# Patient Record
Sex: Male | Born: 1960 | Race: Black or African American | Hispanic: No | Marital: Married | Smoking: Current some day smoker
Health system: Southern US, Community
[De-identification: ages and names within clinical notes are randomized; demographics above are authoritative.]

## PROBLEM LIST (undated history)

## (undated) DIAGNOSIS — I10 Essential (primary) hypertension: Secondary | ICD-10-CM

## (undated) DIAGNOSIS — K219 Gastro-esophageal reflux disease without esophagitis: Secondary | ICD-10-CM

## (undated) DIAGNOSIS — E7800 Pure hypercholesterolemia, unspecified: Secondary | ICD-10-CM

## (undated) DIAGNOSIS — K859 Acute pancreatitis without necrosis or infection, unspecified: Secondary | ICD-10-CM

## (undated) HISTORY — PX: COLOSTOMY: SHX63

## (undated) HISTORY — PX: COLOSTOMY REVERSAL: SHX5782

---

## 2010-06-10 ENCOUNTER — Emergency Department (HOSPITAL_BASED_OUTPATIENT_CLINIC_OR_DEPARTMENT_OTHER): Admission: EM | Admit: 2010-06-10 | Discharge: 2010-06-10 | Payer: Self-pay | Admitting: Emergency Medicine

## 2010-11-14 LAB — BASIC METABOLIC PANEL
BUN: 9 mg/dL (ref 6–23)
Calcium: 9.3 mg/dL (ref 8.4–10.5)
GFR calc non Af Amer: >60 mL/min (ref >60)
Glucose, Bld: 105 mg/dL — ABNORMAL HIGH (ref 70–99)

## 2011-05-24 ENCOUNTER — Emergency Department (HOSPITAL_BASED_OUTPATIENT_CLINIC_OR_DEPARTMENT_OTHER)
Admission: EM | Admit: 2011-05-24 | Discharge: 2011-05-24 | Disposition: A | Discharge status: Home / Self Care | Payer: Self-pay | Attending: Emergency Medicine | Admitting: Emergency Medicine

## 2011-05-24 ENCOUNTER — Emergency Department (INDEPENDENT_AMBULATORY_CARE_PROVIDER_SITE_OTHER): Payer: Self-pay

## 2011-05-24 ENCOUNTER — Encounter: Payer: Self-pay | Admitting: *Deleted

## 2011-05-24 DIAGNOSIS — J069 Acute upper respiratory infection, unspecified: Secondary | ICD-10-CM | POA: Insufficient documentation

## 2011-05-24 DIAGNOSIS — E119 Type 2 diabetes mellitus without complications: Secondary | ICD-10-CM | POA: Insufficient documentation

## 2011-05-24 DIAGNOSIS — R509 Fever, unspecified: Secondary | ICD-10-CM

## 2011-05-24 DIAGNOSIS — R1110 Vomiting, unspecified: Secondary | ICD-10-CM | POA: Insufficient documentation

## 2011-05-24 DIAGNOSIS — R0602 Shortness of breath: Secondary | ICD-10-CM

## 2011-05-24 DIAGNOSIS — I10 Essential (primary) hypertension: Secondary | ICD-10-CM | POA: Insufficient documentation

## 2011-05-24 DIAGNOSIS — R42 Dizziness and giddiness: Secondary | ICD-10-CM

## 2011-05-24 DIAGNOSIS — K219 Gastro-esophageal reflux disease without esophagitis: Secondary | ICD-10-CM | POA: Insufficient documentation

## 2011-05-24 DIAGNOSIS — R05 Cough: Secondary | ICD-10-CM

## 2011-05-24 HISTORY — DX: Essential (primary) hypertension: I10

## 2011-05-24 HISTORY — DX: Gastro-esophageal reflux disease without esophagitis: K21.9

## 2011-05-24 LAB — BASIC METABOLIC PANEL
CO2: 19 mEq/L (ref 19–32)
Calcium: 10 mg/dL (ref 8.4–10.5)
Creatinine, Ser: 0.9 mg/dL (ref 0.50–1.35)
GFR calc Af Amer: >60 mL/min (ref >60)

## 2011-05-24 LAB — CBC
MCV: 83.8 fL (ref 78.0–100.0)
Platelets: 158 10*3/uL (ref 150–400)
RDW: 12.1 % (ref 11.5–15.5)
WBC: 3.9 10*3/uL — ABNORMAL LOW (ref 4.0–10.5)

## 2011-05-24 MED ORDER — ONDANSETRON HCL 4 MG/2ML IJ SOLN
4.0000 mg | Freq: Once | INTRAMUSCULAR | Status: AC
  Administered 2011-05-24: 4 mg via INTRAVENOUS
  Filled 2011-05-24: qty 2

## 2011-05-24 MED ORDER — SODIUM CHLORIDE 0.9 % IV BOLUS (SEPSIS)
250.0000 mL | Freq: Once | INTRAVENOUS | Status: AC
  Administered 2011-05-24: 1000 mL via INTRAVENOUS

## 2011-05-24 MED ORDER — ONDANSETRON 8 MG PO TBDP: 8.0000 mg | ORAL_TABLET | Freq: Three times a day (TID) | ORAL | Status: AC | PRN

## 2011-05-24 MED ORDER — SODIUM CHLORIDE 0.9 % IV SOLN: INTRAVENOUS | Status: DC

## 2011-05-24 MED ORDER — AMLODIPINE BESYLATE 10 MG PO TABS: 5.0000 mg | ORAL_TABLET | Freq: Two times a day (BID) | ORAL | Status: AC

## 2011-05-24 NOTE — ED Notes (Signed)
Patient states that he started feeling bad last night, vomited last night and this morning, light headed, fever, shortness of breath, productive cough, brown colored per patient, took alka seltzer last night

## 2011-06-29 NOTE — ED Provider Notes (Signed)
History     CSN: [id number] Arrival date & time: 05/24/2011 10:56 AM   None     Chief Complaint  Patient presents with  . Shortness of Breath    (Consider location/radiation/quality/duration/timing/severity/associated sxs/prior treatment) Patient is a 50 y.o. male presenting with shortness of breath. The history is provided by the patient.  Shortness of Breath  The current episode started yesterday. The problem occurs frequently. The problem has been unchanged. The problem is moderate. The symptoms are relieved by nothing. The symptoms are aggravated by nothing. Associated symptoms include a fever, cough and shortness of breath. Pertinent negatives include no chest pain, no chest pressure, no sore throat, no stridor and no wheezing. The cough is productive. There is no color change Manson Passey) associated with the cough. Nothing relieves the cough. Nothing worsens the cough. His past medical history does not include asthma. There were no sick contacts.   Patient started feeling bad yesterday vomited last night and this morning today feels lightheaded feels feverish short of breath productive cough of brown-colored sputum. No diarrhea. Past Medical History  Diagnosis Date  . Hypertension   . Diabetes mellitus   . Acid reflux     No past surgical history on file.  No family history on file.  History  Substance Use Topics  . Smoking status: Current Some Day Smoker  . Smokeless tobacco: Not on file  . Alcohol Use: Yes      Review of Systems  Constitutional: Positive for fever.  HENT: Positive for congestion. Negative for sore throat and neck pain.   Eyes: Negative for redness.  Respiratory: Positive for cough and shortness of breath. Negative for wheezing and stridor.   Cardiovascular: Negative for chest pain.  Genitourinary: Negative for dysuria and hematuria.  Musculoskeletal: Negative for back pain.  Neurological: Negative for headaches.  Hematological: Does not bruise/bleed  easily.    Allergies  Review of patient's allergies indicates no known allergies.  Home Medications   Current Outpatient Rx  Name Route Sig Dispense Refill  . AMLODIPINE BESYLATE PO Oral Take by mouth.      Marland Kitchen UNKNOWN TO PATIENT  Pill for diabetes     . AMLODIPINE BESYLATE 10 MG PO TABS Oral Take 0.5 tablets (5 mg total) by mouth 2 (two) times daily. 60 tablet 0    BP 176/100  Pulse 79  Temp(Src) 97.5 F (36.4 C) (Oral)  Resp 19  SpO2 100%  Physical Exam  Nursing note and vitals reviewed. Constitutional: He is oriented to person, place, and time. He appears well-developed and well-nourished. No distress.  HENT:  Head: Normocephalic and atraumatic.  Mouth/Throat: Oropharynx is clear and moist.  Eyes: Conjunctivae are normal. Pupils are equal, round, and reactive to light.  Neck: Normal range of motion. Neck supple.  Cardiovascular: Normal rate, regular rhythm and normal heart sounds.   No murmur heard. Pulmonary/Chest: Effort normal and breath sounds normal. He has no wheezes. He has no rales.  Abdominal: Soft. Bowel sounds are normal. There is no tenderness.  Musculoskeletal: Normal range of motion. He exhibits no edema.  Lymphadenopathy:    He has no cervical adenopathy.  Neurological: He is alert and oriented to person, place, and time. No cranial nerve deficit. He exhibits normal muscle tone. Coordination normal.  Skin: Skin is warm and dry. No rash noted. He is not diaphoretic. No erythema.    ED Course  Procedures (including critical care time)  Labs Reviewed  BASIC METABOLIC PANEL - Abnormal; Notable for the  following:    Glucose, Bld 143 (*)    All other components within normal limits  CBC - Abnormal; Notable for the following:    WBC 3.9 (*)    HCT 38.4 (*)    MCHC 37.0 (*)    All other components within normal limits   Results for orders placed during the hospital encounter of 05/24/11  BASIC METABOLIC PANEL      Component Value Range   Sodium 139   135 - 145 (mEq/L)   Potassium 3.7  3.5 - 5.1 (mEq/L)   Chloride 100  96 - 112 (mEq/L)   CO2 19  19 - 32 (mEq/L)   Glucose, Bld 143 (*) 70 - 99 (mg/dL)   BUN 9  6 - 23 (mg/dL)   Creatinine, Ser 1.61  0.50 - 1.35 (mg/dL)   Calcium 09.6  8.4 - 10.5 (mg/dL)   GFR calc non Af Amer >60  >60 (mL/min)   GFR calc Af Amer >60  >60 (mL/min)  CBC      Component Value Range   WBC 3.9 (*) 4.0 - 10.5 (K/uL)   RBC 4.58  4.22 - 5.81 (MIL/uL)   Hemoglobin 14.2  13.0 - 17.0 (g/dL)   HCT 04.5 (*) 40.9 - 52.0 (%)   MCV 83.8  78.0 - 100.0 (fL)   MCH 31.0  26.0 - 34.0 (pg)   MCHC 37.0 (*) 30.0 - 36.0 (g/dL)   RDW 81.1  91.4 - 78.2 (%)   Platelets 158  150 - 400 (K/uL)   Results for orders placed during the hospital encounter of 05/24/11  BASIC METABOLIC PANEL      Component Value Range   Sodium 139  135 - 145 (mEq/L)   Potassium 3.7  3.5 - 5.1 (mEq/L)   Chloride 100  96 - 112 (mEq/L)   CO2 19  19 - 32 (mEq/L)   Glucose, Bld 143 (*) 70 - 99 (mg/dL)   BUN 9  6 - 23 (mg/dL)   Creatinine, Ser 9.56  0.50 - 1.35 (mg/dL)   Calcium 21.3  8.4 - 10.5 (mg/dL)   GFR calc non Af Amer >60  >60 (mL/min)   GFR calc Af Amer >60  >60 (mL/min)  CBC      Component Value Range   WBC 3.9 (*) 4.0 - 10.5 (K/uL)   RBC 4.58  4.22 - 5.81 (MIL/uL)   Hemoglobin 14.2  13.0 - 17.0 (g/dL)   HCT 08.6 (*) 57.8 - 52.0 (%)   MCV 83.8  78.0 - 100.0 (fL)   MCH 31.0  26.0 - 34.0 (pg)   MCHC 37.0 (*) 30.0 - 36.0 (g/dL)   RDW 46.9  62.9 - 52.8 (%)   Platelets 158  150 - 400 (K/uL)   Results for orders placed during the hospital encounter of 05/24/11  BASIC METABOLIC PANEL      Component Value Range   Sodium 139  135 - 145 (mEq/L)   Potassium 3.7  3.5 - 5.1 (mEq/L)   Chloride 100  96 - 112 (mEq/L)   CO2 19  19 - 32 (mEq/L)   Glucose, Bld 143 (*) 70 - 99 (mg/dL)   BUN 9  6 - 23 (mg/dL)   Creatinine, Ser 4.13  0.50 - 1.35 (mg/dL)   Calcium 24.4  8.4 - 10.5 (mg/dL)   GFR calc non Af Amer >60  >60 (mL/min)   GFR calc Af Amer  >60  >60 (mL/min)  CBC  Component Value Range   WBC 3.9 (*) 4.0 - 10.5 (K/uL)   RBC 4.58  4.22 - 5.81 (MIL/uL)   Hemoglobin 14.2  13.0 - 17.0 (g/dL)   HCT 29.5 (*) 62.1 - 52.0 (%)   MCV 83.8  78.0 - 100.0 (fL)   MCH 31.0  26.0 - 34.0 (pg)   MCHC 37.0 (*) 30.0 - 36.0 (g/dL)   RDW 30.8  65.7 - 84.6 (%)   Platelets 158  150 - 400 (K/uL)      Impression: 1. Upper respiratory infection   2. Vomiting       MDM   Labs White blood cell count suggestive of viral process. Patient afebrile in the emergency department in no acute distress and nontoxic. Patient will be discharged home with Zofran for the vomiting and patient needed care Norvasc renewed that was done.        Shelda Jakes, MD 06/29/11 [phone]

## 2012-10-16 ENCOUNTER — Emergency Department (HOSPITAL_BASED_OUTPATIENT_CLINIC_OR_DEPARTMENT_OTHER): Payer: Self-pay

## 2012-10-16 ENCOUNTER — Emergency Department (HOSPITAL_BASED_OUTPATIENT_CLINIC_OR_DEPARTMENT_OTHER)
Admission: EM | Admit: 2012-10-16 | Discharge: 2012-10-16 | Disposition: A | Discharge status: Other Acute Inpatient Hospital | Payer: Self-pay | Attending: Emergency Medicine | Admitting: Emergency Medicine

## 2012-10-16 ENCOUNTER — Encounter (HOSPITAL_BASED_OUTPATIENT_CLINIC_OR_DEPARTMENT_OTHER): Payer: Self-pay | Admitting: *Deleted

## 2012-10-16 DIAGNOSIS — E119 Type 2 diabetes mellitus without complications: Secondary | ICD-10-CM | POA: Insufficient documentation

## 2012-10-16 DIAGNOSIS — F1210 Cannabis abuse, uncomplicated: Secondary | ICD-10-CM | POA: Insufficient documentation

## 2012-10-16 DIAGNOSIS — R42 Dizziness and giddiness: Secondary | ICD-10-CM | POA: Insufficient documentation

## 2012-10-16 DIAGNOSIS — R7402 Elevation of levels of lactic acid dehydrogenase (LDH): Secondary | ICD-10-CM | POA: Insufficient documentation

## 2012-10-16 DIAGNOSIS — R112 Nausea with vomiting, unspecified: Secondary | ICD-10-CM | POA: Insufficient documentation

## 2012-10-16 DIAGNOSIS — R7401 Elevation of levels of liver transaminase levels: Secondary | ICD-10-CM | POA: Insufficient documentation

## 2012-10-16 DIAGNOSIS — R197 Diarrhea, unspecified: Secondary | ICD-10-CM | POA: Insufficient documentation

## 2012-10-16 DIAGNOSIS — R630 Anorexia: Secondary | ICD-10-CM | POA: Insufficient documentation

## 2012-10-16 DIAGNOSIS — R509 Fever, unspecified: Secondary | ICD-10-CM | POA: Insufficient documentation

## 2012-10-16 DIAGNOSIS — R634 Abnormal weight loss: Secondary | ICD-10-CM | POA: Insufficient documentation

## 2012-10-16 DIAGNOSIS — Z8719 Personal history of other diseases of the digestive system: Secondary | ICD-10-CM | POA: Insufficient documentation

## 2012-10-16 DIAGNOSIS — Z79899 Other long term (current) drug therapy: Secondary | ICD-10-CM | POA: Insufficient documentation

## 2012-10-16 DIAGNOSIS — I10 Essential (primary) hypertension: Secondary | ICD-10-CM | POA: Insufficient documentation

## 2012-10-16 DIAGNOSIS — R5381 Other malaise: Secondary | ICD-10-CM | POA: Insufficient documentation

## 2012-10-16 DIAGNOSIS — D649 Anemia, unspecified: Secondary | ICD-10-CM | POA: Insufficient documentation

## 2012-10-16 DIAGNOSIS — K859 Acute pancreatitis without necrosis or infection, unspecified: Secondary | ICD-10-CM | POA: Insufficient documentation

## 2012-10-16 DIAGNOSIS — R109 Unspecified abdominal pain: Secondary | ICD-10-CM | POA: Insufficient documentation

## 2012-10-16 DIAGNOSIS — F17200 Nicotine dependence, unspecified, uncomplicated: Secondary | ICD-10-CM | POA: Insufficient documentation

## 2012-10-16 DIAGNOSIS — Z933 Colostomy status: Secondary | ICD-10-CM | POA: Insufficient documentation

## 2012-10-16 HISTORY — DX: Acute pancreatitis without necrosis or infection, unspecified: K85.90

## 2012-10-16 LAB — CBC WITH DIFFERENTIAL/PLATELET
Eosinophils Relative: 0 % (ref 0–5)
HCT: 27.2 % — ABNORMAL LOW (ref 39.0–52.0)
Hemoglobin: 9.7 g/dL — ABNORMAL LOW (ref 13.0–17.0)
Lymphocytes Relative: 12 % (ref 12–46)
Lymphs Abs: 0.7 10*3/uL (ref 0.7–4.0)
MCV: 87.7 fL (ref 78.0–100.0)
Monocytes Absolute: 1.1 10*3/uL — ABNORMAL HIGH (ref 0.1–1.0)
Monocytes Relative: 18 % — ABNORMAL HIGH (ref 3–12)
Neutro Abs: 4.2 10*3/uL (ref 1.7–7.7)
RBC: 3.1 MIL/uL — ABNORMAL LOW (ref 4.22–5.81)
WBC: 6 10*3/uL (ref 4.0–10.5)

## 2012-10-16 LAB — COMPREHENSIVE METABOLIC PANEL
Albumin: 2.7 g/dL — ABNORMAL LOW (ref 3.5–5.2)
Alkaline Phosphatase: 112 U/L (ref 39–117)
BUN: 7 mg/dL (ref 6–23)
Calcium: 9.3 mg/dL (ref 8.4–10.5)
Creatinine, Ser: 1 mg/dL (ref 0.50–1.35)
Potassium: 3.1 mEq/L — ABNORMAL LOW (ref 3.5–5.1)
Total Protein: 6.4 g/dL (ref 6.0–8.3)

## 2012-10-16 LAB — URINALYSIS, ROUTINE W REFLEX MICROSCOPIC
Bilirubin Urine: NEGATIVE
Nitrite: NEGATIVE
Protein, ur: NEGATIVE mg/dL
Specific Gravity, Urine: 1.013 (ref 1.005–1.030)
Urobilinogen, UA: 0.2 mg/dL (ref 0.0–1.0)

## 2012-10-16 LAB — LACTIC ACID, PLASMA: Lactic Acid, Venous: 3.5 mmol/L — ABNORMAL HIGH (ref 0.5–2.2)

## 2012-10-16 LAB — AMYLASE: Amylase: 58 U/L (ref 0–105)

## 2012-10-16 MED ORDER — SODIUM CHLORIDE 0.9 % IV BOLUS (SEPSIS)
1000.0000 mL | Freq: Once | INTRAVENOUS | Status: AC
  Administered 2012-10-16: 1000 mL via INTRAVENOUS

## 2012-10-16 MED ORDER — ONDANSETRON HCL 4 MG/2ML IJ SOLN
4.0000 mg | Freq: Once | INTRAMUSCULAR | Status: AC
  Administered 2012-10-16: 4 mg via INTRAVENOUS

## 2012-10-16 MED ORDER — PANTOPRAZOLE SODIUM 40 MG IV SOLR
40.0000 mg | Freq: Once | INTRAVENOUS | Status: AC
  Administered 2012-10-16: 40 mg via INTRAVENOUS
  Filled 2012-10-16: qty 40

## 2012-10-16 MED ORDER — POTASSIUM CHLORIDE CRYS ER 20 MEQ PO TBCR
40.0000 meq | EXTENDED_RELEASE_TABLET | Freq: Once | ORAL | Status: AC
  Administered 2012-10-16: 40 meq via ORAL
  Filled 2012-10-16: qty 2

## 2012-10-16 MED ORDER — HYDROMORPHONE HCL PF 1 MG/ML IJ SOLN
1.0000 mg | Freq: Once | INTRAMUSCULAR | Status: AC
  Administered 2012-10-16: 1 mg via INTRAVENOUS
  Filled 2012-10-16: qty 1

## 2012-10-16 MED ORDER — IOHEXOL 300 MG/ML  SOLN
100.0000 mL | Freq: Once | INTRAMUSCULAR | Status: AC | PRN
  Administered 2012-10-16: 100 mL via INTRAVENOUS

## 2012-10-16 MED ORDER — HYDROMORPHONE HCL PF 1 MG/ML IJ SOLN
1.0000 mg | Freq: Once | INTRAMUSCULAR | Status: AC
  Administered 2012-10-16: 1 mg via INTRAVENOUS

## 2012-10-16 MED ORDER — HYDROMORPHONE HCL PF 1 MG/ML IJ SOLN
INTRAMUSCULAR | Status: AC
  Filled 2012-10-16: qty 1

## 2012-10-16 MED ORDER — ONDANSETRON HCL 4 MG/2ML IJ SOLN
INTRAMUSCULAR | Status: AC
  Administered 2012-10-16: 4 mg via INTRAVENOUS
  Filled 2012-10-16: qty 2

## 2012-10-16 MED ORDER — IOHEXOL 300 MG/ML  SOLN
50.0000 mL | Freq: Once | INTRAMUSCULAR | Status: AC | PRN
  Administered 2012-10-16: 50 mL via ORAL

## 2012-10-16 MED ORDER — POTASSIUM CHLORIDE 10 MEQ/100ML IV SOLN: 10.0000 meq | INTRAVENOUS | Status: DC

## 2012-10-16 NOTE — ED Provider Notes (Signed)
History     CSN: [id number]  Arrival date & time 10/16/12  1110   First MD Initiated Contact with Patient 10/16/12 1137      Chief Complaint  Patient presents with  . Flank Pain    (Consider location/radiation/quality/duration/timing/severity/associated sxs/prior treatment) HPI Comments: 52 y.o PMH HTN,GERD, DM, ? diverticulitis presents with left flank pain worsening and constant x 2 days but present intermittently x 1.5 months.  Pain is 10/10.  Associated with n/v, decreased urine, change in urine stream, decreased po, lightheadedness, resolved blood in stool, subjective fever, wt loss x 10-12 lbs, decreased energy, diarrhea.  Denies hematuria.    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 male presenting with flank pain. The history is provided by the patient and the spouse. No language interpreter was used.  Flank Pain This is a new problem. The current episode started more than 1 month ago. The problem occurs constantly. The problem has been gradually worsening. Associated symptoms include anorexia, fatigue, a fever, nausea, vomiting and weakness. Pertinent negatives include no chest pain. Nothing aggravates the symptoms. He has tried nothing for the symptoms.    Past Medical History  Diagnosis Date  . Hypertension   . Diabetes mellitus   . Acid reflux   . Pancreatitis     Past Surgical History  Procedure Laterality Date  . Colostomy      No family history on file.  History  Substance Use Topics  . Smoking status: Current Some Day Smoker  . Smokeless tobacco: Not on file  . Alcohol Use: Yes     Comment: "a couple 40's every day"      Review of Systems  Constitutional: Positive for fever, appetite change, fatigue and unexpected weight change.  Respiratory: Negative for shortness of  breath.   Cardiovascular: Negative for chest pain.  Gastrointestinal: Positive for nausea, vomiting, diarrhea and anorexia.       Left flank pain   Genitourinary: Positive for flank pain, decreased urine volume and difficulty urinating. Negative for dysuria and hematuria.  Neurological: Positive for weakness and light-headedness.  All other systems reviewed and are negative.    Allergies  Review of patient's allergies indicates no known allergies.  Home Medications   Current Outpatient Rx  Name  Route  Sig  Dispense  Refill  . metFORMIN (GLUCOPHAGE) 1000 MG tablet   Oral   Take 1,000 mg by mouth 2 (two) times daily with a meal.         . EXPIRED: amLODipine (NORVASC) 10 MG tablet   Oral   Take 0.5 tablets (5 mg total) by mouth 2 (two) times daily.   60 tablet   0   . AMLODIPINE BESYLATE PO   Oral   Take by mouth.           Marland Kitchen UNKNOWN TO PATIENT      Pill for diabetes            BP 144/82  Pulse 93  Temp(Src) 97.9 F (36.6 C) (Oral)  Resp 18  Ht 5' 7.5" (1.715 m)  Wt 147 lb (66.679 kg)  BMI 22.67 kg/m2  SpO2 99%  Physical Exam  Nursing note and vitals reviewed. Constitutional: He is oriented to person, place, and time. He appears well-developed and well-nourished. He is cooperative. He appears distressed.  HENT:  Head:  Normocephalic and atraumatic.  Mouth/Throat: Oropharynx is clear and moist and mucous membranes are normal. Abnormal dentition. No oropharyngeal exudate.  Eyes: Conjunctivae are normal. Pupils are equal, round, and reactive to light. Right eye exhibits no discharge. Left eye exhibits no discharge. No scleral icterus.  Cardiovascular: Regular rhythm, S1 normal and S2 normal.  Tachycardia present.   No murmur heard. Pulmonary/Chest: Effort normal and breath sounds normal. No respiratory distress. He has no wheezes.  Abdominal: Soft. Bowel sounds are normal. He exhibits no distension. There is tenderness. There is CVA tenderness.  Left flank  ttp ttp LUQ, LLQ, RLQ Surgical scars to abdomen from previous surgery and colostomy  Genitourinary: Rectal exam shows external hemorrhoid. Guaiac negative stool.  Stool in vault   Neurological: He is alert and oriented to person, place, and time.  Skin: Skin is warm, dry and intact. No rash noted. He is not diaphoretic.  Psychiatric: He has a normal mood and affect. His speech is normal and behavior is normal. Judgment and thought content normal. Cognition and memory are normal.    ED Course  Procedures (including critical care time)  Labs Reviewed  URINALYSIS, ROUTINE W REFLEX MICROSCOPIC - Abnormal; Notable for the following:    APPearance CLOUDY (*)    All other components within normal limits  COMPREHENSIVE METABOLIC PANEL - Abnormal; Notable for the following:    Sodium 126 (*)    Potassium 3.1 (*)    Chloride 87 (*)    Glucose, Bld 252 (*)    Albumin 2.7 (*)    AST 93 (*)    ALT 60 (*)    GFR calc non Af Amer 85 (*)    All other components within normal limits  CBC WITH DIFFERENTIAL - Abnormal; Notable for the following:    RBC 3.10 (*)    Hemoglobin 9.7 (*)    HCT 27.2 (*)    Platelets 91 (*)    Monocytes Relative 18 (*)    Monocytes Absolute 1.1 (*)    All other components within normal limits  URINE CULTURE  LIPASE, BLOOD  OCCULT BLOOD X 1 CARD TO LAB, STOOL  LACTIC ACID, PLASMA  AMYLASE   Ct Abdomen Pelvis W Contrast  10/16/2012  *RADIOLOGY REPORT*  Clinical Data: Flank pain  CT ABDOMEN AND PELVIS WITH CONTRAST  Technique:  Multidetector CT imaging of the abdomen and pelvis was performed following the standard protocol during bolus administration of intravenous contrast.  Contrast: 50mL OMNIPAQUE IOHEXOL 300 MG/ML  SOLN, OMNIPAQUE IOHEXOL 300 MG/ML  SOLN  Comparison: None.  Findings: There is a heterogeneous and ill-defined mass in the tail of the pancreas measuring 4.8 x 2.6 cm.  There is surrounding fluid density and stranding in the adjacent fat.  This  extends inferiorly and medially along the body of the pancreas.  No definite gallstones.  There are patchy areas of low density adjacent to the gallbladder with a geographic border.  No obvious inflammatory change of the gallbladder.  Diffuse hepatic steatosis.  Kidneys, adrenal glands are within normal limits.  Normal appendix.  Bladder is distended.  Soft tissue density is present towards the base of the bladder dependently of unknown significance.  Atherosclerotic changes of the aorta are noted.  Small para-aortic nodes.  Borderline 10 mm short axis diameter para aortic node on image 32.  Postoperative changes from sigmoid resection.  IMPRESSION: Mass in the tail of the pancreas as described.  This may represent an inflammatory mass associated with acute pancreatitis.  Short- term follow-up imaging is recommended to ensure resolution.  The abnormality fails to resolve, biopsy may be necessary.  Low density areas adjacent to the gallbladder favored represent focal fatty infiltration.  Borderline para-aortic adenopathy.   Original Report Authenticated By: Jolaine Click, M.D.      1. Left flank pain   2. Abdominal pain   3. Normocytic anemia   4. Elevated transaminase level   5. Acute pancreatitis      Date: 10/16/2012  Rate: 87  Rhythm: normal sinus rhythm  QRS Axis: normal  Intervals: QT prolonged  ST/T Wave abnormalities: nonspecific ST changes  Conduction Disutrbances:none  Narrative Interpretation:   Old EKG Reviewed: changes noted    MDM  Left flank pain   +Acute pancreatitis on CT ab/pelvis with contrast  AG 20 FOBT negative  Dilaudid for pain prn Elevated transaminases  Pending amylase, lactate to follow  Hypokalemia-kdur 40 x 1 Normocytic anemia  Admit to [facility] for further w/u, treatment and management.    Desma Maxim MD [phone]          Annett Gula, MD 10/16/12 1357  Annett Gula, MD 10/16/12 1448  Annett Gula, MD 10/16/12 [phone]

## 2012-10-16 NOTE — ED Notes (Signed)
Patient states that he is having left flank pain. For the past few days, pain with difficulty urinating.

## 2012-10-16 NOTE — ED Provider Notes (Signed)
I have personally seen and examined the patient.  I have discussed the plan of care with the resident.  I have reviewed the documentation on PMH/FH/Soc. History.  I have reviewed the documentation of the resident and agree.  I have reviewed and agree with the ECG interpretation(s) documented by the resident.  Pt with continued abdominal pain, CT findings noted.  Will need admission for pain control, rehydration.  He does not have surgical abdomen currently  No signs of acute abdominal/inguinal hernia.  He Denies active CP .  I advised admission.  I spoke to dr Myriam Forehand at [facility], will accept in transfer to med-surg bed.    Joya Gaskins, MD 10/16/12 1501

## 2012-10-17 LAB — URINE CULTURE: Culture: NO GROWTH

## 2012-10-28 ENCOUNTER — Emergency Department (HOSPITAL_BASED_OUTPATIENT_CLINIC_OR_DEPARTMENT_OTHER): Payer: Self-pay

## 2012-10-28 ENCOUNTER — Encounter (HOSPITAL_BASED_OUTPATIENT_CLINIC_OR_DEPARTMENT_OTHER): Payer: Self-pay

## 2012-10-28 ENCOUNTER — Emergency Department (HOSPITAL_BASED_OUTPATIENT_CLINIC_OR_DEPARTMENT_OTHER)
Admission: EM | Admit: 2012-10-28 | Discharge: 2012-10-28 | Disposition: A | Discharge status: Home / Self Care | Payer: Self-pay | Attending: Emergency Medicine | Admitting: Emergency Medicine

## 2012-10-28 DIAGNOSIS — D649 Anemia, unspecified: Secondary | ICD-10-CM | POA: Insufficient documentation

## 2012-10-28 DIAGNOSIS — I10 Essential (primary) hypertension: Secondary | ICD-10-CM | POA: Insufficient documentation

## 2012-10-28 DIAGNOSIS — Z79899 Other long term (current) drug therapy: Secondary | ICD-10-CM | POA: Insufficient documentation

## 2012-10-28 DIAGNOSIS — R51 Headache: Secondary | ICD-10-CM | POA: Insufficient documentation

## 2012-10-28 DIAGNOSIS — R110 Nausea: Secondary | ICD-10-CM | POA: Insufficient documentation

## 2012-10-28 DIAGNOSIS — J329 Chronic sinusitis, unspecified: Secondary | ICD-10-CM | POA: Insufficient documentation

## 2012-10-28 DIAGNOSIS — R6883 Chills (without fever): Secondary | ICD-10-CM | POA: Insufficient documentation

## 2012-10-28 DIAGNOSIS — J3489 Other specified disorders of nose and nasal sinuses: Secondary | ICD-10-CM | POA: Insufficient documentation

## 2012-10-28 DIAGNOSIS — F17200 Nicotine dependence, unspecified, uncomplicated: Secondary | ICD-10-CM | POA: Insufficient documentation

## 2012-10-28 DIAGNOSIS — H5789 Other specified disorders of eye and adnexa: Secondary | ICD-10-CM | POA: Insufficient documentation

## 2012-10-28 DIAGNOSIS — Z8719 Personal history of other diseases of the digestive system: Secondary | ICD-10-CM | POA: Insufficient documentation

## 2012-10-28 DIAGNOSIS — E119 Type 2 diabetes mellitus without complications: Secondary | ICD-10-CM | POA: Insufficient documentation

## 2012-10-28 LAB — CBC WITH DIFFERENTIAL/PLATELET
Basophils Relative: 0 % (ref 0–1)
Hemoglobin: 7.3 g/dL — ABNORMAL LOW (ref 13.0–17.0)
Lymphs Abs: 1.6 10*3/uL (ref 0.7–4.0)
Monocytes Relative: 14 % — ABNORMAL HIGH (ref 3–12)
Neutro Abs: 4.2 10*3/uL (ref 1.7–7.7)
Neutrophils Relative %: 61 % (ref 43–77)
Platelets: 361 10*3/uL (ref 150–400)
RBC: 2.36 MIL/uL — ABNORMAL LOW (ref 4.22–5.81)

## 2012-10-28 MED ORDER — OXYMETAZOLINE HCL 0.05 % NA SOLN: 2.0000 | Freq: Two times a day (BID) | NASAL | Status: AC

## 2012-10-28 MED ORDER — OXYCODONE-ACETAMINOPHEN 5-325 MG PO TABS
1.0000 | ORAL_TABLET | Freq: Once | ORAL | Status: AC
  Administered 2012-10-28: 1 via ORAL
  Filled 2012-10-28 (×2): qty 1

## 2012-10-28 MED ORDER — AMOXICILLIN-POT CLAVULANATE 875-125 MG PO TABS: 1.0000 | ORAL_TABLET | Freq: Two times a day (BID) | ORAL | Status: DC

## 2012-10-28 NOTE — ED Provider Notes (Signed)
History     CSN: [id number]  Arrival date & time 10/28/12  0901   First MD Initiated Contact with Patient 10/28/12 [phone]      Chief Complaint  Patient presents with  . Headache    (Consider location/radiation/quality/duration/timing/severity/associated sxs/prior treatment) HPI Comments: 52 y.o PMH HTN, pancreatitis (recent admission 10/2012).  He presents for h/a since Saturday night.  He had a recent d/c from [facility] then returned for angioedema due to lisinopril on Thursday prior to todays visit.  Due to the significant amount of angioedema ENT did a fiberoptic scope to his left nasal turbinate and history sounds like he was nasally intubated for a couple of days due to angioedema and extubated Friday.  H/a started Saturday night. Location is left parietal scalp down the left side of face. Pain was 10/10 now 8/10. Tried Ibuprofen without relief.  Associated with chills.  He states it hurts to blink his left eye, areas of his left face are ttp.  Denies photophobia, nausea, dizziness, lightheadedness.  He is tolerating orals.    SH: works in maintenance   Patient is a 52 y.o. male presenting with headaches. The history is provided by the patient. No language interpreter was used.  Headache Pain location:  L parietal (left face) Quality: aching. Radiates to:  Does not radiate Severity at highest:  8/10 Onset quality:  Gradual Timing:  Constant Progression:  Unchanged Chronicity:  New Similar to prior headaches: no history of h/a.   Relieved by:  Nothing Worsened by:  Nothing tried Ineffective treatments:  NSAIDs Associated symptoms: facial pain and sinus pressure   Associated symptoms: no abdominal pain, no dizziness, no fever, no nausea, no paresthesias, no photophobia, no visual change and no vomiting     Past Medical History  Diagnosis Date  . Hypertension   . Diabetes mellitus   . Acid reflux   . Pancreatitis     Past Surgical History  Procedure Laterality  Date  . Colostomy      No family history on file.  History  Substance Use Topics  . Smoking status: Current Some Day Smoker  . Smokeless tobacco: Not on file  . Alcohol Use: Yes     Comment: states he quit 2 weeks ago      Review of Systems  Constitutional: Positive for chills. Negative for fever.  HENT: Positive for sinus pressure.   Eyes: Negative for photophobia.  Respiratory: Negative for shortness of breath.   Cardiovascular: Negative for chest pain.  Gastrointestinal: Negative for nausea, vomiting and abdominal pain.  Neurological: Positive for headaches. Negative for dizziness, light-headedness and paresthesias.       Denies recent falls or trauma   All other systems reviewed and are negative.    Allergies  Lisinopril  Home Medications   Current Outpatient Rx  Name  Route  Sig  Dispense  Refill  . EXPIRED: amLODipine (NORVASC) 10 MG tablet   Oral   Take 0.5 tablets (5 mg total) by mouth 2 (two) times daily.   60 tablet   0   . AMLODIPINE BESYLATE PO   Oral   Take by mouth.           Marland Kitchen amoxicillin-clavulanate (AUGMENTIN) 875-125 MG per tablet   Oral   Take 1 tablet by mouth 2 (two) times daily.   14 tablet   0   . metFORMIN (GLUCOPHAGE) 1000 MG tablet   Oral   Take 1,000 mg by mouth 2 (two) times daily  with a meal.         . oxymetazoline (AFRIN NASAL SPRAY) 0.05 % nasal spray   Nasal   Place 2 sprays into the nose 2 (two) times daily. Use for only 3 days then stop   30 mL   0   . UNKNOWN TO PATIENT      Pill for diabetes            BP 136/84  Pulse 79  Temp(Src) 98.2 F (36.8 C) (Oral)  Resp 20  SpO2 99%  Physical Exam  Nursing note and vitals reviewed. Constitutional: He is oriented to person, place, and time. He appears well-developed and well-nourished. He is cooperative.  HENT:  Head: Normocephalic and atraumatic.  Nose: Right sinus exhibits no maxillary sinus tenderness and no frontal sinus tenderness. Left sinus  exhibits maxillary sinus tenderness and frontal sinus tenderness.  Mouth/Throat: Oropharynx is clear and moist and mucous membranes are normal. No oropharyngeal exudate.  Right nose with blood  Left nose with dried mucous, edematous  +left ethmoid ttp  Eyes: Conjunctivae are normal. Pupils are equal, round, and reactive to light. Right eye exhibits no discharge. Left eye exhibits no discharge. No scleral icterus.  Cardiovascular: Normal rate, regular rhythm, S1 normal, S2 normal and normal heart sounds.   No murmur heard. Pulmonary/Chest: Effort normal and breath sounds normal.  Abdominal: Soft. Bowel sounds are normal. He exhibits no distension. There is no tenderness.  Musculoskeletal: He exhibits no edema.  Neurological: He is alert and oriented to person, place, and time. He has normal strength.  CN 2-12 grossly intact  Intact coordination  Intact 5/5 motor strength all 4 extremities  Left face more painful to palpation   Skin: Skin is warm, dry and intact. No rash noted.  Psychiatric: He has a normal mood and affect. His speech is normal and behavior is normal. Judgment and thought content normal. Cognition and memory are normal.    ED Course  Procedures (including critical care time)  Labs Reviewed  CBC WITH DIFFERENTIAL   Ct Maxillofacial  Ltd Wo Cm  10/28/2012  *RADIOLOGY REPORT*  Clinical Data:  Status post nasal intubation last week.  Evaluate for sinusitis.  CT PARANASAL SINUS LIMITED WITHOUT CONTRAST  Technique:  Multidetector CT images of the paranasal sinuses were obtained in a single plane without contrast.  Comparison:  No priors.  Findings:  Limited sinus CT demonstrates near complete opacification of the left maxillary sinus. The contents of the left maxillary sinus are intermediate attenuation ranging from 20-36 HU (i.e., they are not frankly bloody).  Frontal sinuses, ethmoid sinuses, right maxillary sinus and sphenoid sinuses are otherwise well pneumatized.   IMPRESSION: 1.  Near complete opacification of the left maxillary sinus may suggest sinusitis.   Original Report Authenticated By: Trudie Reed, M.D.      1. Sinusitis   2. Headache   3. Anemia       MDM  CT sinus limited, cbc Percocet Rx Augmentin bid x 7 days, Afrin x 3 days    CBC with hemoglobin 7.3-patient to establish PCP in 1 week to f/u   [facility] MD [phone]         Annett Gula, MD 10/28/12 1116

## 2012-10-28 NOTE — ED Provider Notes (Signed)
I saw and evaluated the patient, reviewed the resident's note and I agree with the findings and plan. Patient complaining of left-sided facial pain and discolored nasal discharge. Recently patient was moving easily intubated 2 angioedema from lisinopril. He was extubated on Friday and symptoms started on Saturday. He denies any visual changes or neuro complaints. CT of the sinuses showed complete opacified left maxillary sinus. Will place patient on antibiotics and gave nasal decongestants  Gwyneth Sprout, MD 10/28/12 1609

## 2012-10-28 NOTE — ED Notes (Signed)
Pt reports a headache that started Saturday night.  He was d/c'd from [facility] Saturday after an anaphylactic reaction to Lisionopril.

## 2014-03-29 ENCOUNTER — Emergency Department (HOSPITAL_BASED_OUTPATIENT_CLINIC_OR_DEPARTMENT_OTHER)
Admission: EM | Admit: 2014-03-29 | Discharge: 2014-03-29 | Disposition: A | Discharge status: Home / Self Care | Payer: Self-pay | Attending: Emergency Medicine | Admitting: Emergency Medicine

## 2014-03-29 ENCOUNTER — Encounter (HOSPITAL_BASED_OUTPATIENT_CLINIC_OR_DEPARTMENT_OTHER): Payer: Self-pay | Admitting: Emergency Medicine

## 2014-03-29 DIAGNOSIS — L237 Allergic contact dermatitis due to plants, except food: Secondary | ICD-10-CM

## 2014-03-29 DIAGNOSIS — F17200 Nicotine dependence, unspecified, uncomplicated: Secondary | ICD-10-CM | POA: Insufficient documentation

## 2014-03-29 DIAGNOSIS — Z792 Long term (current) use of antibiotics: Secondary | ICD-10-CM | POA: Insufficient documentation

## 2014-03-29 DIAGNOSIS — Z79899 Other long term (current) drug therapy: Secondary | ICD-10-CM | POA: Insufficient documentation

## 2014-03-29 DIAGNOSIS — R21 Rash and other nonspecific skin eruption: Secondary | ICD-10-CM | POA: Insufficient documentation

## 2014-03-29 DIAGNOSIS — I10 Essential (primary) hypertension: Secondary | ICD-10-CM | POA: Insufficient documentation

## 2014-03-29 DIAGNOSIS — L255 Unspecified contact dermatitis due to plants, except food: Secondary | ICD-10-CM | POA: Insufficient documentation

## 2014-03-29 DIAGNOSIS — E119 Type 2 diabetes mellitus without complications: Secondary | ICD-10-CM | POA: Insufficient documentation

## 2014-03-29 DIAGNOSIS — Z8719 Personal history of other diseases of the digestive system: Secondary | ICD-10-CM | POA: Insufficient documentation

## 2014-03-29 HISTORY — DX: Pure hypercholesterolemia, unspecified: E78.00

## 2014-03-29 MED ORDER — PREDNISONE 50 MG PO TABS
60.0000 mg | ORAL_TABLET | Freq: Once | ORAL | Status: AC
  Administered 2014-03-29: 60 mg via ORAL
  Filled 2014-03-29 (×2): qty 1

## 2014-03-29 MED ORDER — PREDNISONE 10 MG PO TABS: ORAL_TABLET | ORAL | Status: DC

## 2014-03-29 MED ORDER — HYDROXYZINE HCL 25 MG PO TABS: 25.0000 mg | ORAL_TABLET | Freq: Four times a day (QID) | ORAL | Status: AC

## 2014-03-29 NOTE — Discharge Instructions (Signed)
Poison Ivy  Poison ivy is a rash caused by touching the leaves of the poison ivy plant. The rash often shows up 48 hours later. You might just have bumps, redness, and itching. Sometimes, blisters appear and break open. Your eyes may get puffy (swollen). Poison ivy often heals in 2 to 3 weeks without treatment.  HOME CARE  · If you touch poison ivy:  ¨ Wash your skin with soap and water right away. Wash under your fingernails. Do not rub the skin very hard.  ¨ Wash any clothes you were wearing.  · Avoid poison ivy in the future. Poison ivy has 3 leaves on a stem.  · Use medicine to help with itching as told by your doctor. Do not drive when you take this medicine.  · Keep open sores dry, clean, and covered with a bandage and medicated cream, if needed.  · Ask your doctor about medicine for children.  GET HELP RIGHT AWAY IF:  · You have open sores.  · Redness spreads beyond the area of the rash.  · There is yellowish white fluid (pus) coming from the rash.  · Pain gets worse.  · You have a temperature by mouth above 102° F (38.9° C), not controlled by medicine.  MAKE SURE YOU:  · Understand these instructions.  · Will watch your condition.  · Will get help right away if you are not doing well or get worse.  Document Released: 09/20/2010 Document Revised: 11/10/2011 Document Reviewed: 09/20/2010  [employer]® Patient Information ©2015 [employer]. This information is not intended to replace advice given to you by your health care provider. Make sure you discuss any questions you have with your health care provider.

## 2014-03-29 NOTE — ED Notes (Signed)
Patient has raised bumpy rash all over arms, hands, and neck. States that he was working in his yard a few days ago.

## 2014-03-29 NOTE — ED Provider Notes (Signed)
CSN: [id number]     Arrival date & time 03/29/14  1144 History   First MD Initiated Contact with Patient 03/29/14 1156     Chief Complaint  Patient presents with  . Rash     HPI  Patient presents with a rash. He was cleaning some IV off of his stents to 3 days ago. Notice a rash the following day it is worsened. Itching. On his neck shoulders chest arms.  Past Medical History  Diagnosis Date  . Hypertension   . Diabetes mellitus   . Acid reflux   . Pancreatitis   . Hypercholesteremia    Past Surgical History  Procedure Laterality Date  . Colostomy     No family history on file. History  Substance Use Topics  . Smoking status: Current Some Day Smoker  . Smokeless tobacco: Not on file  . Alcohol Use: Yes     Comment: drinks beer "everyday"    Review of Systems  Constitutional: Negative for fever, chills, diaphoresis, appetite change and fatigue.  HENT: Negative for mouth sores, sore throat and trouble swallowing.   Eyes: Negative for visual disturbance.  Respiratory: Negative for cough, chest tightness, shortness of breath and wheezing.   Cardiovascular: Negative for chest pain.  Gastrointestinal: Negative for nausea, vomiting, abdominal pain, diarrhea and abdominal distention.  Endocrine: Negative for polydipsia, polyphagia and polyuria.  Genitourinary: Negative for dysuria, frequency and hematuria.  Musculoskeletal: Negative for gait problem.  Skin: Positive for rash. Negative for color change and pallor.  Neurological: Negative for dizziness, syncope, light-headedness and headaches.  Hematological: Does not bruise/bleed easily.  Psychiatric/Behavioral: Negative for behavioral problems and confusion.      Allergies  Lisinopril  Home Medications   Prior to Admission medications   Medication Sig Start Date End Date Taking? Authorizing Provider  amLODipine (NORVASC) 10 MG tablet Take 0.5 tablets (5 mg total) by mouth 2 (two) times daily. 05/24/11 05/23/12  Vanetta Mulders, MD  AMLODIPINE BESYLATE PO Take by mouth.      Historical Provider, MD  amoxicillin-clavulanate (AUGMENTIN) 875-125 MG per tablet Take 1 tablet by mouth 2 (two) times daily. 10/28/12   Annett Gula, MD  hydrOXYzine (ATARAX/VISTARIL) 25 MG tablet Take 1 tablet (25 mg total) by mouth every 6 (six) hours. 03/29/14   Rolland Porter, MD  metFORMIN (GLUCOPHAGE) 1000 MG tablet Take 1,000 mg by mouth 2 (two) times daily with a meal.    Historical Provider, MD  oxymetazoline (AFRIN NASAL SPRAY) 0.05 % nasal spray Place 2 sprays into the nose 2 (two) times daily. Use for only 3 days then stop 10/28/12   Annett Gula, MD  predniSONE (DELTASONE) 10 MG tablet 1 po qd x 5d, then qd x 5days 03/29/14   Rolland Porter, MD  UNKNOWN TO PATIENT Pill for diabetes     Historical Provider, MD   BP 165/93  Pulse 96  Temp(Src) 97.9 F (36.6 C) (Oral)  Resp 16  Ht 5\' 7"  (1.702 m)  Wt 157 lb (71.215 kg)  BMI 24.58 kg/m2  SpO2 100% Physical Exam  Constitutional: He is oriented to person, place, and time. He appears well-developed and well-nourished. No distress.  HENT:  Head: Normocephalic.  Eyes: Conjunctivae are normal. Pupils are equal, round, and reactive to light. No scleral icterus.  Neck: Normal range of motion. Neck supple. No thyromegaly present.  Cardiovascular: Normal rate and regular rhythm.  Exam reveals no gallop and no friction rub.   No murmur heard. Pulmonary/Chest: Effort normal  and breath sounds normal. No respiratory distress. He has no wheezes. He has no rales.  Abdominal: Soft. Bowel sounds are normal. He exhibits no distension. There is no tenderness. There is no rebound.  Musculoskeletal: Normal range of motion.  Neurological: He is alert and oriented to person, place, and time.  Skin: Skin is warm and dry. No rash noted.     Psychiatric: He has a normal mood and affect. His behavior is normal.    ED Course  Procedures (including critical care time) Labs Review Labs Reviewed -  No data to display  Imaging Review No results found.   EKG Interpretation None      MDM   Final diagnoses:  Poison ivy    Plan will be treatment for poison ivy/rheus dermatitis.    Rolland PorterMark Johnchristopher Sarvis, MD 03/29/14 1249

## 2014-04-01 ENCOUNTER — Telehealth (HOSPITAL_BASED_OUTPATIENT_CLINIC_OR_DEPARTMENT_OTHER): Payer: Self-pay | Admitting: Emergency Medicine

## 2018-01-22 ENCOUNTER — Emergency Department (HOSPITAL_BASED_OUTPATIENT_CLINIC_OR_DEPARTMENT_OTHER)
Admission: EM | Admit: 2018-01-22 | Discharge: 2018-01-22 | Disposition: A | Discharge status: Home / Self Care | Payer: Self-pay | Attending: Emergency Medicine | Admitting: Emergency Medicine

## 2018-01-22 ENCOUNTER — Encounter (HOSPITAL_BASED_OUTPATIENT_CLINIC_OR_DEPARTMENT_OTHER): Payer: Self-pay | Admitting: *Deleted

## 2018-01-22 ENCOUNTER — Other Ambulatory Visit: Payer: Self-pay

## 2018-01-22 ENCOUNTER — Emergency Department (HOSPITAL_BASED_OUTPATIENT_CLINIC_OR_DEPARTMENT_OTHER): Payer: Self-pay

## 2018-01-22 DIAGNOSIS — M545 Low back pain, unspecified: Secondary | ICD-10-CM

## 2018-01-22 DIAGNOSIS — E119 Type 2 diabetes mellitus without complications: Secondary | ICD-10-CM | POA: Insufficient documentation

## 2018-01-22 DIAGNOSIS — Z7984 Long term (current) use of oral hypoglycemic drugs: Secondary | ICD-10-CM | POA: Insufficient documentation

## 2018-01-22 DIAGNOSIS — I10 Essential (primary) hypertension: Secondary | ICD-10-CM | POA: Insufficient documentation

## 2018-01-22 DIAGNOSIS — Z79899 Other long term (current) drug therapy: Secondary | ICD-10-CM | POA: Insufficient documentation

## 2018-01-22 DIAGNOSIS — F17200 Nicotine dependence, unspecified, uncomplicated: Secondary | ICD-10-CM | POA: Insufficient documentation

## 2018-01-22 LAB — URINALYSIS, ROUTINE W REFLEX MICROSCOPIC
BILIRUBIN URINE: NEGATIVE
Glucose, UA: NEGATIVE mg/dL
Hgb urine dipstick: NEGATIVE
KETONES UR: NEGATIVE mg/dL
Leukocytes, UA: NEGATIVE
NITRITE: NEGATIVE
PH: 6 (ref 5.0–8.0)
Protein, ur: NEGATIVE mg/dL
Specific Gravity, Urine: 1.01 (ref 1.005–1.030)

## 2018-01-22 MED ORDER — METHOCARBAMOL 500 MG PO TABS
500.0000 mg | ORAL_TABLET | Freq: Two times a day (BID) | ORAL | 0 refills | Status: DC
Start: 2018-01-22 — End: 2021-01-02

## 2018-01-22 MED ORDER — OXYCODONE-ACETAMINOPHEN 5-325 MG PO TABS: 1.0000 | ORAL_TABLET | Freq: Once | ORAL | Status: DC

## 2018-01-22 MED ORDER — KETOROLAC TROMETHAMINE 30 MG/ML IJ SOLN
30.0000 mg | Freq: Once | INTRAMUSCULAR | Status: AC
  Administered 2018-01-22: 30 mg via INTRAMUSCULAR
  Filled 2018-01-22: qty 1

## 2018-01-22 MED ORDER — HYDROCODONE-ACETAMINOPHEN 5-325 MG PO TABS: 2.0000 | ORAL_TABLET | Freq: Once | ORAL | Status: DC

## 2018-01-22 MED ORDER — HYDROCODONE-ACETAMINOPHEN 5-325 MG PO TABS: 1.0000 | ORAL_TABLET | Freq: Four times a day (QID) | ORAL | 0 refills | Status: DC | PRN

## 2018-01-22 MED ORDER — IBUPROFEN 600 MG PO TABS: 600.0000 mg | ORAL_TABLET | Freq: Four times a day (QID) | ORAL | 0 refills | Status: DC | PRN

## 2018-01-22 MED ORDER — METHOCARBAMOL 500 MG PO TABS: 750.0000 mg | ORAL_TABLET | Freq: Once | ORAL | Status: DC

## 2018-01-22 NOTE — ED Provider Notes (Signed)
[facility] EMERGENCY DEPARTMENT Provider Note   CSN: [id number] Arrival date & time: 01/22/18  1413     History   Chief Complaint Chief Complaint  Patient presents with  . Flank Pain    HPI Roberto Mccoy is a 57 y.o. male with history of hypertension, diabetes, pink otitis who presents with a 1 day history of right low back and right hip pain.  Patient's pain is worse with movement and straining for bowel movement.  He describes his pain is sharp.  He has used a heating pad at home with some relief.  He has not taken any medications prior to arrival.  He denies any numbness or tingling in his legs, saddle anesthesia, loss of bowel or bladder control,  abdominal pain, chest pain, shortness of breath, nausea, vomiting, urinary symptoms.  He denies any history of cancer, procedures to back, history of IVDU, or fever.  HPI  Past Medical History:  Diagnosis Date  . Acid reflux   . Diabetes mellitus   . Hypercholesteremia   . Hypertension   . Pancreatitis     There are no active problems to display for this patient.   Past Surgical History:  Procedure Laterality Date  . COLOSTOMY          Home Medications    Prior to Admission medications   Medication Sig Start Date End Date Taking? Authorizing Provider  omeprazole (PRILOSEC) 40 MG capsule Take 40 mg by mouth daily.   Yes [provider]  amLODipine (NORVASC) 10 MG tablet Take 0.5 tablets (5 mg total) by mouth 2 (two) times daily. 05/24/11 05/23/12  Vanetta Mulders, MD  AMLODIPINE BESYLATE PO Take by mouth.      [provider]  HYDROcodone-acetaminophen (NORCO/VICODIN) 5-325 MG tablet Take 1-2 tablets by mouth every 6 (six) hours as needed. 01/22/18   Jabarri Stefanelli, Waylan Boga, PA-C  hydrOXYzine (ATARAX/VISTARIL) 25 MG tablet Take 1 tablet (25 mg total) by mouth every 6 (six) hours. 03/29/14   Rolland Porter, MD  ibuprofen (ADVIL,MOTRIN) 600 MG tablet Take 1 tablet (600 mg total) by mouth every 6 (six)  hours as needed. 01/22/18   Peja Allender, Waylan Boga, PA-C  metFORMIN (GLUCOPHAGE) 1000 MG tablet Take 1,000 mg by mouth 2 (two) times daily with a meal.    [provider]  methocarbamol (ROBAXIN) 500 MG tablet Take 1 tablet (500 mg total) by mouth 2 (two) times daily. 01/22/18   Zuleima Haser, Waylan Boga, PA-C  oxymetazoline (AFRIN NASAL SPRAY) 0.05 % nasal spray Place 2 sprays into the nose 2 (two) times daily. Use for only 3 days then stop 10/28/12   McLean-Scocuzza, Pasty Spillers, MD  UNKNOWN TO PATIENT Pill for diabetes     [provider]    Family History History reviewed. No pertinent family history.  Social History Social History   Tobacco Use  . Smoking status: Current Some Day Smoker    Packs/day: 1.00  . Smokeless tobacco: Never Used  Substance Use Topics  . Alcohol use: Yes    Comment: drinks beer "everyday"  . Drug use: Yes    Types: Marijuana     Allergies   Lisinopril   Review of Systems Review of Systems  Constitutional: Negative for chills and fever.  HENT: Negative for facial swelling and sore throat.   Respiratory: Negative for shortness of breath.   Cardiovascular: Negative for chest pain.  Gastrointestinal: Negative for abdominal pain, nausea and vomiting.  Genitourinary: Negative for dysuria and hematuria.  Musculoskeletal: Positive for back pain and myalgias.  Skin: Negative for rash and wound.  Neurological: Negative for headaches.  Psychiatric/Behavioral: The patient is not nervous/anxious.      Physical Exam Updated Vital Signs BP (!) 164/94 (BP Location: Right Arm)   Pulse 72   Temp 98.5 F (36.9 C) (Oral)   Resp 16   Ht  (1.702 m)   Wt 68 kg (150 lb)   SpO2 100%   BMI 23.49 kg/m   Physical Exam  Constitutional: He appears well-developed and well-nourished. No distress.  HENT:  Head: Normocephalic and atraumatic.  Mouth/Throat: Oropharynx is clear and moist. No oropharyngeal exudate.  Eyes: Pupils are equal, round, and reactive  to light. Conjunctivae are normal. Right eye exhibits no discharge. Left eye exhibits no discharge. No scleral icterus.  Neck: Normal range of motion. Neck supple. No thyromegaly present.  Cardiovascular: Normal rate, regular rhythm, normal heart sounds and intact distal pulses. Exam reveals no gallop and no friction rub.  No murmur heard. Pulmonary/Chest: Effort normal and breath sounds normal. No stridor. No respiratory distress. He has no wheezes. He has no rales.  Abdominal: Soft. Bowel sounds are normal. He exhibits no distension. There is no tenderness. There is no rebound, no guarding and no CVA tenderness.  Musculoskeletal: He exhibits no edema.       Back:  No midline cervical, thoracic, or lumbar tenderness Tenderness to the right sided lumbar paraspinal tenderness and right hip  Lymphadenopathy:    He has no cervical adenopathy.  Neurological: He is alert. Coordination normal.  5/5 strength and normal sensation to bilateral lower extremities  Skin: Skin is warm and dry. No rash noted. He is not diaphoretic. No pallor.  Psychiatric: He has a normal mood and affect.  Nursing note and vitals reviewed.    ED Treatments / Results  Labs (all labs ordered are listed, but only abnormal results are displayed) Labs Reviewed  URINALYSIS, ROUTINE W REFLEX MICROSCOPIC    EKG None  Radiology Dg Lumbar Spine Complete  Result Date: 01/22/2018 CLINICAL DATA:  Acute onset low back pain for the past day. No injury. EXAM: LUMBAR SPINE - COMPLETE 4+ VIEW COMPARISON:  CT abdomen pelvis dated December 25, 2016. FINDINGS: Five lumbar type vertebral bodies. Slight levocurvature of the lumbar spine. No acute fracture or subluxation. Vertebral body heights are preserved. Trace anterolisthesis at L4-L5, unchanged. Intervertebral disc spaces are maintained. The sacroiliac joints are intact. Aortoiliac atherosclerotic vascular disease. IMPRESSION: 1. No acute osseous abnormality or significant  degenerative changes. Electronically Signed   By: Obie Dredge M.D.   On: 01/22/2018 17:45   Dg Hip Unilat W Or Wo Pelvis 2-3 Views Right  Result Date: 01/22/2018 CLINICAL DATA:  Acute onset right posterior hip pain for the past day. No injury. EXAM: DG HIP (WITH OR WITHOUT PELVIS) 2-3V RIGHT COMPARISON:  CT abdomen pelvis dated December 25, 2016. FINDINGS: No acute fracture or dislocation. Mild bilateral hip joint space narrowing. The pubic symphysis and sacroiliac joints are intact. No focal bone lesion. Bowel sutures in the left lower quadrant. IMPRESSION: 1.  No acute osseous abnormality. 2. Mild bilateral hip degenerative changes. Electronically Signed   By: Obie Dredge M.D.   On: 01/22/2018 17:47    Procedures Procedures (including critical care time)  Medications Ordered in ED Medications  ketorolac (TORADOL) 30 MG/ML injection 30 mg (30 mg Intramuscular Given 01/22/18 1632)     Initial Impression / Assessment and Plan / ED Course  I  have reviewed the triage vital signs and the nursing notes.  Pertinent labs & imaging results that were available during my care of the patient were reviewed by me and considered in my medical decision making (see chart for details).     Patient with suspected musculoskeletal pain.  X-ray of the right hip and sounds lumbar spine are negative for acute findings.  UA is negative for infection or hematuria.  Low suspicion for kidney stone.  No abdominal tenderness.  Patient's pain is worse with movement and palpation.  Normal neuro exam without focal deficits.  No saddle anesthesia or bowel or bladder incontinence.  Patient is afebrile.  No midline tenderness.  Supportive treatment discussed including ice and heat as well as stretching.  Will discharge home with Robaxin, ibuprofen and short course of Norco, as patient had little relief from Toradol in the ED.  I reviewed the [place] narcotic database and found no discrepancies.  Patient advised to follow-up with  sports medicine for further evaluation.  Return precautions discussed.  Patient understands and agrees with plan.  Patient vitals stable throughout ED course and discharged in satisfactory condition.  Final Clinical Impressions(s) / ED Diagnoses   Final diagnoses:  Acute right-sided low back pain without sciatica    ED Discharge Orders        Ordered    methocarbamol (ROBAXIN) 500 MG tablet  2 times daily     01/22/18 1845    HYDROcodone-acetaminophen (NORCO/VICODIN) 5-325 MG tablet  Every 6 hours PRN     01/22/18 1845    ibuprofen (ADVIL,MOTRIN) 600 MG tablet  Every 6 hours PRN     01/22/18 1846       Jasaun Carn, Waylan Boga, PA-C 01/23/18 0019    Gerhard Munch, MD 01/23/18 [phone]

## 2018-01-22 NOTE — Discharge Instructions (Signed)
Medications: Ibuprofen, Norco, Robaxin  Treatment: Take ibuprofen every 6 hours as prescribed for your pain.  For severe pain, take 1-2 Norco every 6 hours.  Take Robaxin twice daily as needed for muscle pain or spasms.  Do not drive or operate machinery while taking Norco or Robaxin.  Use ice and heat alternating 20 minutes on, 20 minutes off.  Attempt the exercises and stretches as tolerated 1-2 times daily per  Follow-up: Please follow-up with Dr. Pearletha Forge in 3 to 4 days if your symptoms are not improving over the weekend.  Please return to emergency department if you develop any new or worsening symptoms.

## 2018-01-22 NOTE — ED Notes (Signed)
Asked pt if he was driving and he stated that his wife was picking him up.

## 2018-01-22 NOTE — ED Notes (Signed)
ED Provider at bedside. 

## 2018-01-22 NOTE — ED Triage Notes (Signed)
Pt c/o right flank pain x 1 day 

## 2018-10-16 IMAGING — CR DG HIP (WITH OR WITHOUT PELVIS) 2-3V*R*
3 series · 3 of 3 positions shown · non-contrast
Comparison: CT abdomen pelvis dated December 25, 2016.

CLINICAL DATA: Acute onset right posterior hip pain for the past
day. No injury.

EXAM:
DG HIP (WITH OR WITHOUT PELVIS) 2-3V RIGHT

[t pelvis a.p.]
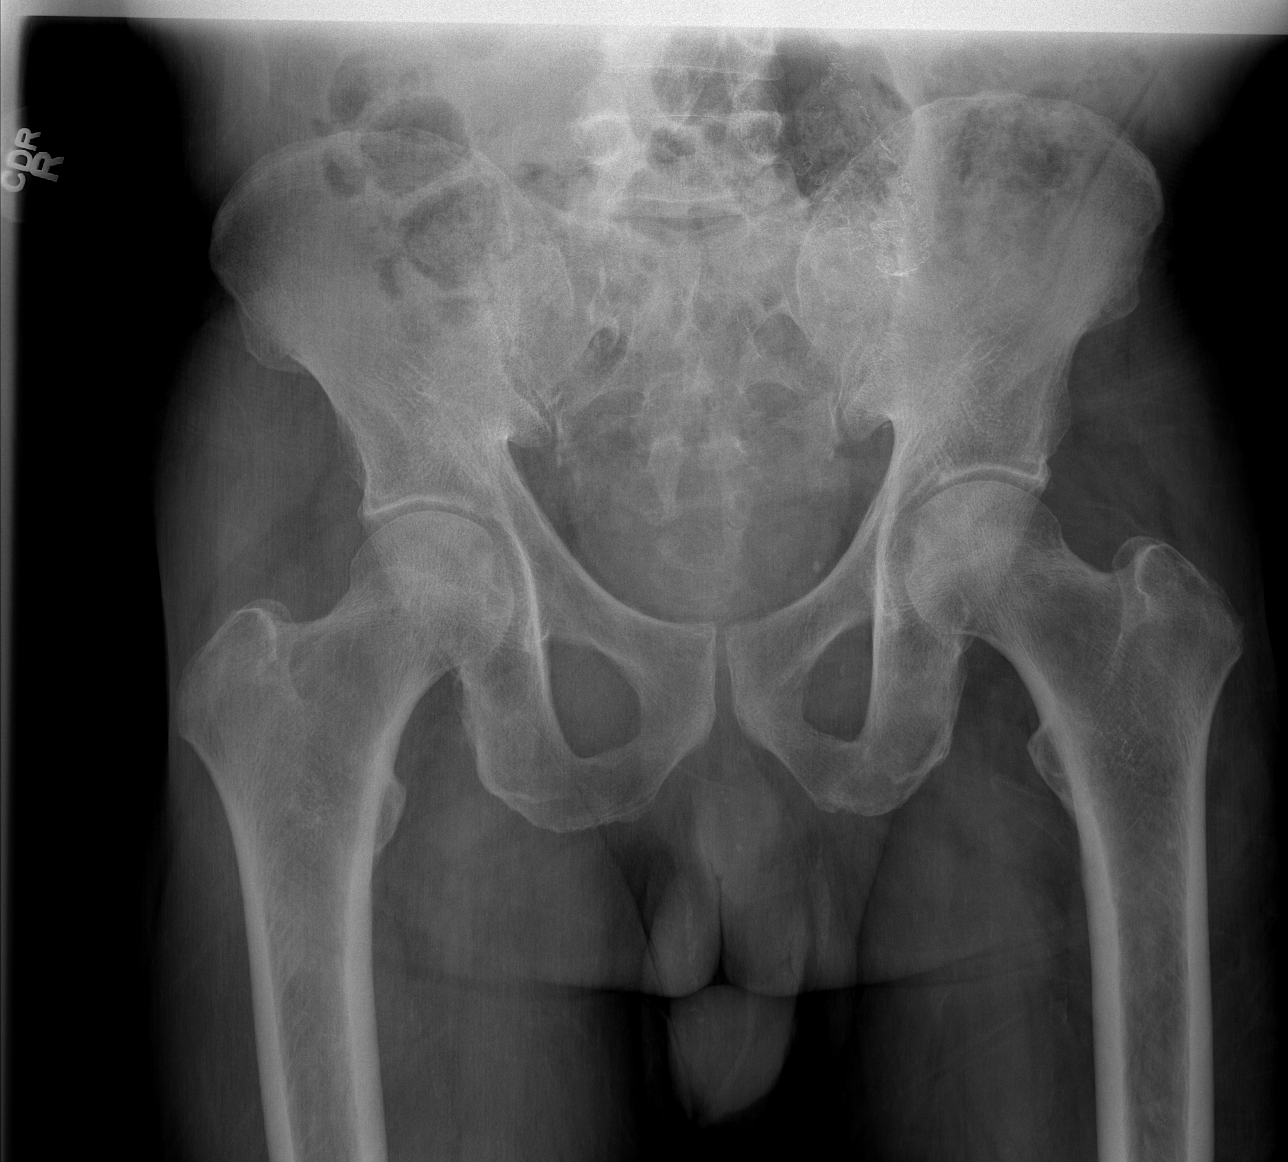

[t hip ap right]
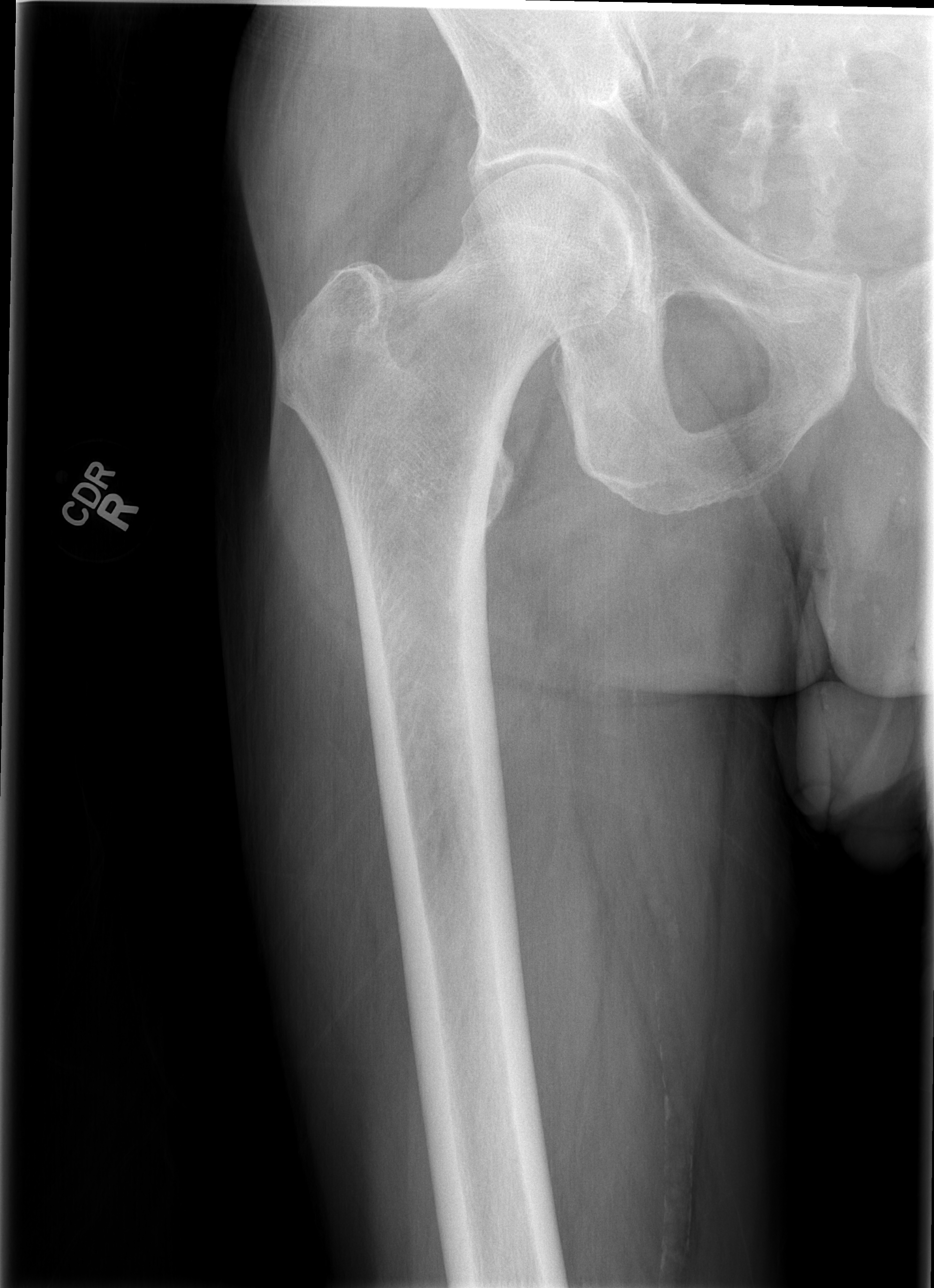

[t hip frog leg right]
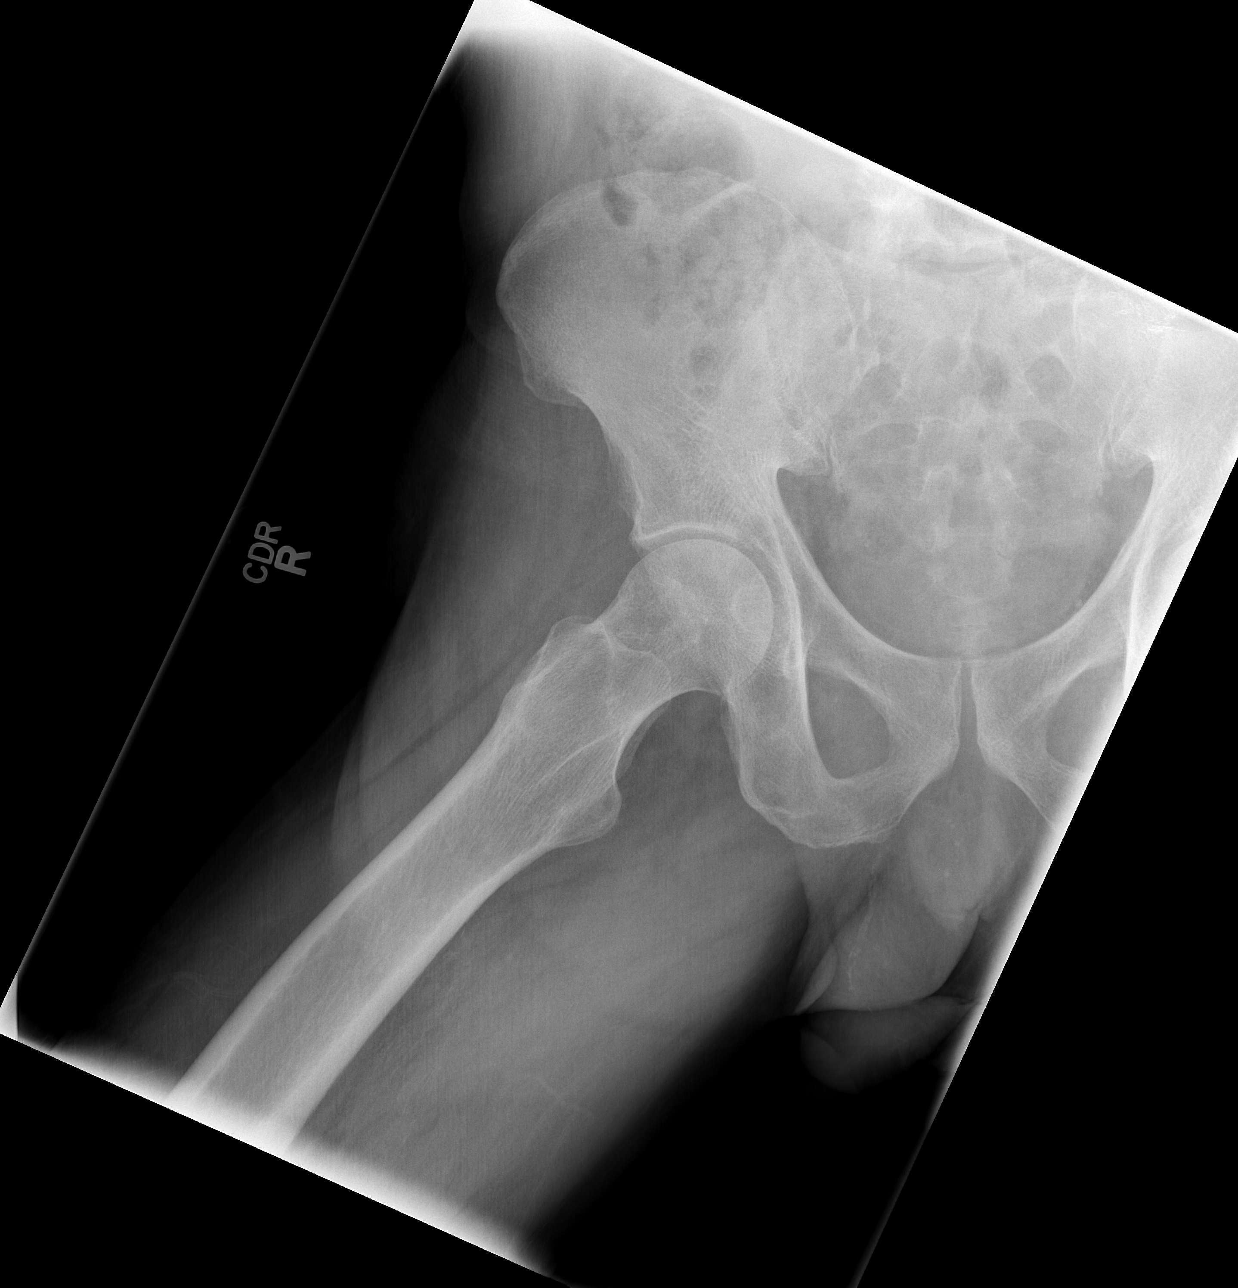

[3 of 3 positions shown; findings below may reference images not displayed]

FINDINGS: No acute fracture or dislocation. Mild bilateral hip joint space
narrowing. The pubic symphysis and sacroiliac joints are intact. No
focal bone lesion. Bowel sutures in the left lower quadrant.
IMPRESSION: 1.  No acute osseous abnormality.
2. Mild bilateral hip degenerative changes.

## 2020-12-27 ENCOUNTER — Encounter (HOSPITAL_BASED_OUTPATIENT_CLINIC_OR_DEPARTMENT_OTHER): Payer: Self-pay

## 2020-12-27 ENCOUNTER — Emergency Department (HOSPITAL_BASED_OUTPATIENT_CLINIC_OR_DEPARTMENT_OTHER)
Admission: EM | Admit: 2020-12-27 | Discharge: 2020-12-27 | Disposition: A | Discharge status: Home / Self Care | Payer: Self-pay | Attending: Emergency Medicine | Admitting: Emergency Medicine

## 2020-12-27 ENCOUNTER — Other Ambulatory Visit: Payer: Self-pay

## 2020-12-27 DIAGNOSIS — I10 Essential (primary) hypertension: Secondary | ICD-10-CM | POA: Insufficient documentation

## 2020-12-27 DIAGNOSIS — E119 Type 2 diabetes mellitus without complications: Secondary | ICD-10-CM | POA: Insufficient documentation

## 2020-12-27 DIAGNOSIS — Z7984 Long term (current) use of oral hypoglycemic drugs: Secondary | ICD-10-CM | POA: Insufficient documentation

## 2020-12-27 DIAGNOSIS — M545 Low back pain, unspecified: Secondary | ICD-10-CM | POA: Insufficient documentation

## 2020-12-27 DIAGNOSIS — Z79899 Other long term (current) drug therapy: Secondary | ICD-10-CM | POA: Insufficient documentation

## 2020-12-27 DIAGNOSIS — F17200 Nicotine dependence, unspecified, uncomplicated: Secondary | ICD-10-CM | POA: Insufficient documentation

## 2020-12-27 MED ORDER — CYCLOBENZAPRINE HCL 10 MG PO TABS: 10.0000 mg | ORAL_TABLET | Freq: Two times a day (BID) | ORAL | 0 refills | Status: DC | PRN

## 2020-12-27 MED ORDER — KETOROLAC TROMETHAMINE 60 MG/2ML IM SOLN
30.0000 mg | Freq: Once | INTRAMUSCULAR | Status: AC
  Administered 2020-12-27: 30 mg via INTRAMUSCULAR
  Filled 2020-12-27: qty 2

## 2020-12-27 MED ORDER — OXYCODONE-ACETAMINOPHEN 5-325 MG PO TABS
1.0000 | ORAL_TABLET | Freq: Once | ORAL | Status: AC
Start: 2020-12-27 — End: 2020-12-27
  Administered 2020-12-27: 1 via ORAL
  Filled 2020-12-27: qty 1

## 2020-12-27 MED ORDER — PREDNISONE 50 MG PO TABS
60.0000 mg | ORAL_TABLET | Freq: Once | ORAL | Status: AC
  Administered 2020-12-27: 60 mg via ORAL
  Filled 2020-12-27: qty 1

## 2020-12-27 MED ORDER — IBUPROFEN 600 MG PO TABS: 600.0000 mg | ORAL_TABLET | Freq: Four times a day (QID) | ORAL | 0 refills | Status: AC | PRN

## 2020-12-27 MED ORDER — CYCLOBENZAPRINE HCL 10 MG PO TABS
10.0000 mg | ORAL_TABLET | Freq: Once | ORAL | Status: AC
  Administered 2020-12-27: 10 mg via ORAL
  Filled 2020-12-27: qty 1

## 2020-12-27 NOTE — ED Provider Notes (Signed)
[facility] EMERGENCY DEPARTMENT Provider Note   CSN: [id number] Arrival date & time: 12/27/20  1547     History Chief Complaint  Patient presents with  . Back Pain    Roberto Mccoy is a 60 y.o. male.  The history is provided by the patient. No language interpreter was used.  Back Pain    60 year old male with history of hypertension, diabetes, hypercholesterolemia who presents for evaluation of back pain.  Patient report he was released from the hospital a week ago due to pancreatitis.  States he went to the store and buy several bags of mulch and while carrying he developed pain to his back.  States pain initially started on the right side of his lower back.  It lasted for about 2 days and he was treated with he denies with some improvement however the pain now radiates to the left side of his back and towards his left shoulder for the past 5 days.  Pain is intense, 9 out of 10, worse with movement and with walking and improves with rest.  No fever lightheadedness chest pain shortness of breath abdominal pain bowel bladder incontinence or saddle anesthesia.  No history of IV drug use or active cancer.  Does have history of hypertension but states he is compliant with his medication.  He mentioned that his diabetes is well controlled with his medication.  He denies any leg numbness.  Past Medical History:  Diagnosis Date  . Acid reflux   . Diabetes mellitus   . Hypercholesteremia   . Hypertension   . Pancreatitis     There are no problems to display for this patient.   Past Surgical History:  Procedure Laterality Date  . COLOSTOMY         History reviewed. No pertinent family history.  Social History   Tobacco Use  . Smoking status: Current Some Day Smoker    Packs/day: 1.00  . Smokeless tobacco: Never Used  Substance Use Topics  . Alcohol use: Yes    Comment: drinks beer "everyday"  . Drug use: Yes    Types: Marijuana    Home Medications Prior to  Admission medications   Medication Sig Start Date End Date Taking? Authorizing Provider  amLODipine (NORVASC) 10 MG tablet Take 0.5 tablets (5 mg total) by mouth 2 (two) times daily. 05/24/11 05/23/12  Vanetta Mulders, MD  AMLODIPINE BESYLATE PO Take by mouth.      [provider]  HYDROcodone-acetaminophen (NORCO/VICODIN) 5-325 MG tablet Take 1-2 tablets by mouth every 6 (six) hours as needed. 01/22/18   Law, Waylan Boga, PA-C  hydrOXYzine (ATARAX/VISTARIL) 25 MG tablet Take 1 tablet (25 mg total) by mouth every 6 (six) hours. 03/29/14   Rolland Porter, MD  ibuprofen (ADVIL,MOTRIN) 600 MG tablet Take 1 tablet (600 mg total) by mouth every 6 (six) hours as needed. 01/22/18   Law, Waylan Boga, PA-C  metFORMIN (GLUCOPHAGE) 1000 MG tablet Take 1,000 mg by mouth 2 (two) times daily with a meal.    [provider]  methocarbamol (ROBAXIN) 500 MG tablet Take 1 tablet (500 mg total) by mouth 2 (two) times daily. 01/22/18   Law, Waylan Boga, PA-C  omeprazole (PRILOSEC) 40 MG capsule Take 40 mg by mouth daily.    [provider]  oxymetazoline (AFRIN NASAL SPRAY) 0.05 % nasal spray Place 2 sprays into the nose 2 (two) times daily. Use for only 3 days then stop 10/28/12   McLean-Scocuzza, Pasty Spillers, MD  Crissie Figures  TO PATIENT Pill for diabetes     [provider]    Allergies    Lisinopril  Review of Systems   Review of Systems  Musculoskeletal: Positive for back pain.  All other systems reviewed and are negative.   Physical Exam Updated Vital Signs BP (!) 182/67   Pulse 67   Temp 98.4 F (36.9 C) (Oral)   Resp 16   Ht 5\' 7"  (1.702 m)   Wt 59.9 kg   SpO2 100%   BMI 20.67 kg/m   Physical Exam Vitals and nursing note reviewed.  Constitutional:      General: He is not in acute distress.    Appearance: He is well-developed.     Comments: Patient is well-appearing, laying in bed appears to be in no acute discomfort.  HENT:     Head: Atraumatic.  Eyes:      Conjunctiva/sclera: Conjunctivae normal.  Cardiovascular:     Rate and Rhythm: Normal rate and regular rhythm.     Pulses: Normal pulses.     Heart sounds: Normal heart sounds.  Pulmonary:     Effort: Pulmonary effort is normal.     Breath sounds: Normal breath sounds. No wheezing, rhonchi or rales.  Abdominal:     General: Bowel sounds are normal.     Palpations: Abdomen is soft. There is no mass.     Comments: No abdominal pulsatile mass.  Abdomen nontender  Musculoskeletal:        General: Tenderness (Tenderness to lumbar paraspinal muscles on palpation with negative straight leg raise bilaterally.  Intact distal pedal pulse and intact sensation to lower extremities.) present.     Cervical back: Neck supple.     Comments: No significant midline spine tenderness crepitus or step-off  Skin:    Capillary Refill: Capillary refill takes less than 2 seconds.     Findings: No rash.  Neurological:     Mental Status: He is alert and oriented to person, place, and time.  Psychiatric:        Mood and Affect: Mood normal.     ED Results / Procedures / Treatments   Labs (all labs ordered are listed, but only abnormal results are displayed) Labs Reviewed - No data to display  EKG None  Radiology No results found.  Procedures Procedures   Medications Ordered in ED Medications - No data to display  ED Course  I have reviewed the triage vital signs and the nursing notes.  Pertinent labs & imaging results that were available during my care of the patient were reviewed by me and considered in my medical decision making (see chart for details).    MDM Rules/Calculators/A&P                          BP (!) 198/78   Pulse 69   Temp 98.4 F (36.9 C) (Oral)   Resp 16   Ht 5\' 7"  (1.702 m)   Wt 59.9 kg   SpO2 100%   BMI 20.67 kg/m   Final Clinical Impression(s) / ED Diagnoses Final diagnoses:  Acute left-sided low back pain without sciatica  Hypertension, unspecified type     Rx / DC Orders ED Discharge Orders         Ordered    ibuprofen (ADVIL) 600 MG tablet  Every 6 hours PRN        12/27/20 1951    cyclobenzaprine (FLEXERIL) 10 MG tablet  2 times  daily PRN        12/27/20 1951         6:02 PM Patient here with lower back pain ongoing for nearly a week after lifting several bags of mulch.  Pain is likely MSK.  I have low suspicion for aortic dissection, cauda equina, or other emergent medical condition.  Patient was found to be hypertensive with blood pressure 198/78 likely secondary to pain.  He is able to ambulate.  7:05 PM Patient given prednisone as well as Toradol.  He was able to ambulate but states no improvement of his symptoms.  Will provide additional symptom control.  7:51 PM Patient reported no improvement of symptoms.  He is able to ambulate.  Will discharge home with symptomatic treatment and Strict return precaution.  At this time he is resting comfortably watching TV.  Strong encourage patient to follow-up closely with PCP for management of his high blood pressure.  I have low suspicion for hypertensive emergency.   Fayrene Helper, PA-C 12/27/20 2056    Vanetta Mulders, MD 12/28/20 [phone]

## 2020-12-27 NOTE — ED Notes (Signed)
ED Provider at bedside. 

## 2020-12-27 NOTE — Discharge Instructions (Signed)
Please take medication prescribed as needed for your back pain.  Take Epson salt bath, do regular stretching for symptom control.  Your blood pressure is high today, please follow-up closely with your doctor for recheck and further management.  Return to the ER if you are experiencing any numbness, chest pain, lightheadedness, or if you have any other concerns.

## 2020-12-27 NOTE — ED Notes (Signed)
Pt ambulated in hall. Completed one full lap around unit and stated that the pain remained the same as before.

## 2020-12-27 NOTE — ED Triage Notes (Signed)
Pt from home c/o left lower back pain. 9/10. States it is hard to stand up on the left leg due to the pain.

## 2021-01-02 ENCOUNTER — Emergency Department (HOSPITAL_BASED_OUTPATIENT_CLINIC_OR_DEPARTMENT_OTHER)
Admission: EM | Admit: 2021-01-02 | Discharge: 2021-01-02 | Disposition: A | Discharge status: Home / Self Care | Payer: Self-pay | Attending: Emergency Medicine | Admitting: Emergency Medicine

## 2021-01-02 ENCOUNTER — Other Ambulatory Visit (HOSPITAL_BASED_OUTPATIENT_CLINIC_OR_DEPARTMENT_OTHER): Payer: Self-pay

## 2021-01-02 ENCOUNTER — Other Ambulatory Visit: Payer: Self-pay

## 2021-01-02 ENCOUNTER — Encounter (HOSPITAL_BASED_OUTPATIENT_CLINIC_OR_DEPARTMENT_OTHER): Payer: Self-pay

## 2021-01-02 DIAGNOSIS — M62838 Other muscle spasm: Secondary | ICD-10-CM | POA: Insufficient documentation

## 2021-01-02 DIAGNOSIS — F17210 Nicotine dependence, cigarettes, uncomplicated: Secondary | ICD-10-CM | POA: Insufficient documentation

## 2021-01-02 DIAGNOSIS — E119 Type 2 diabetes mellitus without complications: Secondary | ICD-10-CM | POA: Insufficient documentation

## 2021-01-02 DIAGNOSIS — Z79899 Other long term (current) drug therapy: Secondary | ICD-10-CM | POA: Insufficient documentation

## 2021-01-02 DIAGNOSIS — I10 Essential (primary) hypertension: Secondary | ICD-10-CM | POA: Insufficient documentation

## 2021-01-02 DIAGNOSIS — Z7984 Long term (current) use of oral hypoglycemic drugs: Secondary | ICD-10-CM | POA: Insufficient documentation

## 2021-01-02 MED ORDER — METHOCARBAMOL 500 MG PO TABS
500.0000 mg | ORAL_TABLET | Freq: Two times a day (BID) | ORAL | 0 refills | Status: AC
  Filled 2021-01-02: qty 20, 10d supply, fill #0

## 2021-01-02 MED ORDER — METHYLPREDNISOLONE 4 MG PO TBPK
ORAL_TABLET | ORAL | 0 refills | Status: AC
  Filled 2021-01-02: qty 21, 6d supply, fill #0

## 2021-01-02 MED ORDER — KETOROLAC TROMETHAMINE 30 MG/ML IJ SOLN
30.0000 mg | Freq: Once | INTRAMUSCULAR | Status: AC
  Administered 2021-01-02: 30 mg via INTRAMUSCULAR
  Filled 2021-01-02: qty 1

## 2021-01-02 NOTE — ED Triage Notes (Signed)
Pt c/o "pain to the whole left side of my body" since d/c from hospital x 1 week ago-to triage in w/c-NAD

## 2021-01-02 NOTE — ED Provider Notes (Signed)
[facility] EMERGENCY DEPARTMENT Provider Note   CSN: [id number] Arrival date & time: 01/02/21  1522     History Chief Complaint  Patient presents with  . Pain    Roberto Mccoy is a 60 y.o. male.  The history is provided by the patient.  Illness Location:  Back pain Severity:  Mild Onset quality:  Gradual Timing:  Intermittent Progression:  Waxing and waning Chronicity:  Recurrent Context:  Pain in the back mostly left-sided in the thoracic and lower spine.  No loss of bowel or bladder.  No saddle anesthesia.  No weakness or numbness.  Pain with movement but able to ambulate well. Relieved by:  Nothing Worsened by:  Nothing Ineffective treatments:  Lidocaine patch and motrin Associated symptoms: no abdominal pain, no chest pain, no congestion, no cough, no diarrhea, no ear pain, no fatigue, no fever, no headaches, no loss of consciousness, no myalgias, no nausea, no rash, no rhinorrhea, no shortness of breath, no sore throat, no vomiting and no wheezing        Past Medical History:  Diagnosis Date  . Acid reflux   . Diabetes mellitus   . Hypercholesteremia   . Hypertension   . Pancreatitis     There are no problems to display for this patient.   Past Surgical History:  Procedure Laterality Date  . COLOSTOMY    . COLOSTOMY REVERSAL         No family history on file.  Social History   Tobacco Use  . Smoking status: Current Some Day Smoker    Packs/day: 1.00  . Smokeless tobacco: Never Used  Substance Use Topics  . Alcohol use: Not Currently  . Drug use: Yes    Types: Marijuana    Home Medications Prior to Admission medications   Medication Sig Start Date End Date Taking? Authorizing Provider  methylPREDNISolone (MEDROL DOSEPAK) 4 MG TBPK tablet Follow package insert 01/02/21  Yes Rasheen Schewe, DO  amLODipine (NORVASC) 10 MG tablet Take 0.5 tablets (5 mg total) by mouth 2 (two) times daily. 05/24/11 05/23/12  Vanetta Mulders, MD   AMLODIPINE BESYLATE PO Take by mouth.      [provider]  hydrOXYzine (ATARAX/VISTARIL) 25 MG tablet Take 1 tablet (25 mg total) by mouth every 6 (six) hours. 03/29/14   Rolland Porter, MD  ibuprofen (ADVIL) 600 MG tablet Take 1 tablet (600 mg total) by mouth every 6 (six) hours as needed for moderate pain. 12/27/20   Fayrene Helper, PA-C  metFORMIN (GLUCOPHAGE) 1000 MG tablet Take 1,000 mg by mouth 2 (two) times daily with a meal.    [provider]  methocarbamol (ROBAXIN) 500 MG tablet Take 1 tablet (500 mg total) by mouth 2 (two) times daily. 01/02/21   Syrus Nakama, DO  omeprazole (PRILOSEC) 40 MG capsule Take 40 mg by mouth daily.    [provider]  oxymetazoline (AFRIN NASAL SPRAY) 0.05 % nasal spray Place 2 sprays into the nose 2 (two) times daily. Use for only 3 days then stop 10/28/12   McLean-Scocuzza, Pasty Spillers, MD  UNKNOWN TO PATIENT Pill for diabetes     [provider]    Allergies    Lisinopril  Review of Systems   Review of Systems  Constitutional: Negative for chills, fatigue and fever.  HENT: Negative for congestion, ear pain, rhinorrhea and sore throat.   Eyes: Negative for pain and visual disturbance.  Respiratory: Negative for cough, shortness of breath and wheezing.  Cardiovascular: Negative for chest pain and palpitations.  Gastrointestinal: Negative for abdominal pain, diarrhea, nausea and vomiting.  Genitourinary: Negative for dysuria and hematuria.  Musculoskeletal: Positive for arthralgias and back pain. Negative for myalgias.  Skin: Negative for color change, rash and wound.  Neurological: Negative for dizziness, seizures, loss of consciousness, syncope, facial asymmetry, weakness, numbness and headaches.  All other systems reviewed and are negative.   Physical Exam Updated Vital Signs BP 136/79 (BP Location: Right Arm)   Pulse 94   Temp 98.9 F (37.2 C) (Oral)   Resp 20   SpO2 97%   Physical Exam Vitals and nursing  note reviewed.  Constitutional:      General: He is not in acute distress.    Appearance: He is well-developed. He is not ill-appearing.  HENT:     Head: Normocephalic and atraumatic.     Nose: Nose normal.     Mouth/Throat:     Mouth: Mucous membranes are moist.  Eyes:     Conjunctiva/sclera: Conjunctivae normal.     Pupils: Pupils are equal, round, and reactive to light.  Cardiovascular:     Rate and Rhythm: Normal rate and regular rhythm.     Heart sounds: No murmur heard.   Pulmonary:     Effort: Pulmonary effort is normal. No respiratory distress.     Breath sounds: Normal breath sounds.  Abdominal:     Palpations: Abdomen is soft.     Tenderness: There is no abdominal tenderness.  Musculoskeletal:        General: Tenderness present. No swelling. Normal range of motion.     Cervical back: Normal range of motion and neck supple. No tenderness.     Comments: No midline spinal pain of cervical, thoracic, lumbar spine, tenderness with increased muscle tone to the upper thoracic and lumbar spine in the paraspinal region on the left.  Also tenderness within the left gluteus area  Skin:    General: Skin is warm and dry.     Capillary Refill: Capillary refill takes less than 2 seconds.  Neurological:     General: No focal deficit present.     Mental Status: He is alert and oriented to person, place, and time.     Cranial Nerves: No cranial nerve deficit.     Sensory: No sensory deficit.     Motor: No weakness.     Gait: Gait normal.     Comments: 5+ out of 5 strength throughout, normal sensation     ED Results / Procedures / Treatments   Labs (all labs ordered are listed, but only abnormal results are displayed) Labs Reviewed - No data to display  EKG None  Radiology No results found.  Procedures Procedures   Medications Ordered in ED Medications  ketorolac (TORADOL) 30 MG/ML injection 30 mg (has no administration in time range)    ED Course  I have reviewed  the triage vital signs and the nursing notes.  Pertinent labs & imaging results that were available during my care of the patient were reviewed by me and considered in my medical decision making (see chart for details).    MDM Rules/Calculators/A&P                          Roberto Mccoy is a 60 year old male with history of pancreatitis, alcohol abuse who presents the ED with back pain.  Overall consistent with muscle spasm.  No symptoms to suggest cauda equina.  No fever.  No concern for epidural abscess.  No midline spinal pain, no trauma.  No concern for fracture.  Increased muscle tone in the paraspinal muscles on the left side of his back.  Also some tenderness within the gluteal muscle.  He is ambulatory in the room.  He has normal strength and sensation.  No concern for cord injury.  Lidocaine patches he started today providing some relief.  He did some ibuprofen for the last several days as well with not much relief.  Will prescribe Medrol Dosepak and Flexeril.  Given return precautions and discharged in ED in good condition.  Neurovascularly intact as well.  This chart was dictated using voice recognition software.  Despite best efforts to proofread,  errors can occur which can change the documentation meaning.   Final Clinical Impression(s) / ED Diagnoses Final diagnoses:  Muscle spasm    Rx / DC Orders ED Discharge Orders         Ordered    methocarbamol (ROBAXIN) 500 MG tablet  2 times daily        01/02/21 1556    methylPREDNISolone (MEDROL DOSEPAK) 4 MG TBPK tablet        01/02/21 1556           Johnathon Olden, DO 01/02/21 1556

## 2023-03-03 DIAGNOSIS — E11621 Type 2 diabetes mellitus with foot ulcer: Secondary | ICD-10-CM | POA: Diagnosis not present

## 2023-03-03 DIAGNOSIS — I152 Hypertension secondary to endocrine disorders: Secondary | ICD-10-CM | POA: Diagnosis not present

## 2023-03-03 DIAGNOSIS — Z794 Long term (current) use of insulin: Secondary | ICD-10-CM | POA: Diagnosis not present

## 2023-03-03 DIAGNOSIS — L97501 Non-pressure chronic ulcer of other part of unspecified foot limited to breakdown of skin: Secondary | ICD-10-CM | POA: Diagnosis not present

## 2023-03-03 DIAGNOSIS — E785 Hyperlipidemia, unspecified: Secondary | ICD-10-CM | POA: Diagnosis not present

## 2023-03-03 DIAGNOSIS — I70245 Atherosclerosis of native arteries of left leg with ulceration of other part of foot: Secondary | ICD-10-CM | POA: Diagnosis not present

## 2023-03-03 DIAGNOSIS — L97526 Non-pressure chronic ulcer of other part of left foot with bone involvement without evidence of necrosis: Secondary | ICD-10-CM | POA: Diagnosis not present

## 2023-03-03 DIAGNOSIS — E11628 Type 2 diabetes mellitus with other skin complications: Secondary | ICD-10-CM | POA: Diagnosis not present

## 2023-03-03 DIAGNOSIS — K860 Alcohol-induced chronic pancreatitis: Secondary | ICD-10-CM | POA: Diagnosis not present

## 2023-03-03 DIAGNOSIS — E1151 Type 2 diabetes mellitus with diabetic peripheral angiopathy without gangrene: Secondary | ICD-10-CM | POA: Diagnosis not present

## 2023-03-03 DIAGNOSIS — J449 Chronic obstructive pulmonary disease, unspecified: Secondary | ICD-10-CM | POA: Diagnosis not present

## 2023-03-03 DIAGNOSIS — E1159 Type 2 diabetes mellitus with other circulatory complications: Secondary | ICD-10-CM | POA: Diagnosis not present

## 2023-03-03 DIAGNOSIS — Z95820 Peripheral vascular angioplasty status with implants and grafts: Secondary | ICD-10-CM | POA: Diagnosis not present

## 2023-03-03 DIAGNOSIS — E08621 Diabetes mellitus due to underlying condition with foot ulcer: Secondary | ICD-10-CM | POA: Diagnosis not present

## 2023-03-03 DIAGNOSIS — E1142 Type 2 diabetes mellitus with diabetic polyneuropathy: Secondary | ICD-10-CM | POA: Diagnosis not present

## 2023-03-03 DIAGNOSIS — L97529 Non-pressure chronic ulcer of other part of left foot with unspecified severity: Secondary | ICD-10-CM | POA: Diagnosis not present

## 2023-03-03 DIAGNOSIS — D649 Anemia, unspecified: Secondary | ICD-10-CM | POA: Diagnosis not present

## 2023-03-03 DIAGNOSIS — K862 Cyst of pancreas: Secondary | ICD-10-CM | POA: Diagnosis not present

## 2023-03-03 DIAGNOSIS — I739 Peripheral vascular disease, unspecified: Secondary | ICD-10-CM | POA: Diagnosis not present

## 2023-03-03 DIAGNOSIS — E1165 Type 2 diabetes mellitus with hyperglycemia: Secondary | ICD-10-CM | POA: Diagnosis not present

## 2023-03-03 DIAGNOSIS — S91302A Unspecified open wound, left foot, initial encounter: Secondary | ICD-10-CM | POA: Diagnosis not present

## 2023-03-03 DIAGNOSIS — G4733 Obstructive sleep apnea (adult) (pediatric): Secondary | ICD-10-CM | POA: Diagnosis not present

## 2023-03-03 DIAGNOSIS — I10 Essential (primary) hypertension: Secondary | ICD-10-CM | POA: Diagnosis not present

## 2023-03-03 DIAGNOSIS — L03116 Cellulitis of left lower limb: Secondary | ICD-10-CM | POA: Diagnosis not present

## 2023-03-03 DIAGNOSIS — E1169 Type 2 diabetes mellitus with other specified complication: Secondary | ICD-10-CM | POA: Diagnosis not present

## 2023-03-03 DIAGNOSIS — I700 Atherosclerosis of aorta: Secondary | ICD-10-CM | POA: Diagnosis not present

## 2023-03-03 DIAGNOSIS — G8929 Other chronic pain: Secondary | ICD-10-CM | POA: Diagnosis not present

## 2023-03-03 DIAGNOSIS — Z87891 Personal history of nicotine dependence: Secondary | ICD-10-CM | POA: Diagnosis not present

## 2023-03-03 DIAGNOSIS — L089 Local infection of the skin and subcutaneous tissue, unspecified: Secondary | ICD-10-CM | POA: Diagnosis not present

## 2023-03-03 DIAGNOSIS — K8689 Other specified diseases of pancreas: Secondary | ICD-10-CM | POA: Diagnosis not present

## 2023-03-03 DIAGNOSIS — N179 Acute kidney failure, unspecified: Secondary | ICD-10-CM | POA: Diagnosis not present

## 2023-03-04 DIAGNOSIS — I871 Compression of vein: Secondary | ICD-10-CM | POA: Diagnosis not present

## 2023-03-04 DIAGNOSIS — E08621 Diabetes mellitus due to underlying condition with foot ulcer: Secondary | ICD-10-CM | POA: Diagnosis not present

## 2023-03-04 DIAGNOSIS — M19072 Primary osteoarthritis, left ankle and foot: Secondary | ICD-10-CM | POA: Diagnosis not present

## 2023-03-04 DIAGNOSIS — R600 Localized edema: Secondary | ICD-10-CM | POA: Diagnosis not present

## 2023-03-04 DIAGNOSIS — M7989 Other specified soft tissue disorders: Secondary | ICD-10-CM | POA: Diagnosis not present

## 2023-03-04 DIAGNOSIS — L97501 Non-pressure chronic ulcer of other part of unspecified foot limited to breakdown of skin: Secondary | ICD-10-CM | POA: Diagnosis not present

## 2023-03-05 DIAGNOSIS — L089 Local infection of the skin and subcutaneous tissue, unspecified: Secondary | ICD-10-CM | POA: Diagnosis not present

## 2023-03-05 DIAGNOSIS — E11628 Type 2 diabetes mellitus with other skin complications: Secondary | ICD-10-CM | POA: Diagnosis not present

## 2023-03-08 DIAGNOSIS — E11628 Type 2 diabetes mellitus with other skin complications: Secondary | ICD-10-CM | POA: Diagnosis not present

## 2023-03-08 DIAGNOSIS — L089 Local infection of the skin and subcutaneous tissue, unspecified: Secondary | ICD-10-CM | POA: Diagnosis not present

## 2023-03-09 DIAGNOSIS — E11621 Type 2 diabetes mellitus with foot ulcer: Secondary | ICD-10-CM | POA: Diagnosis not present

## 2023-03-09 DIAGNOSIS — I739 Peripheral vascular disease, unspecified: Secondary | ICD-10-CM | POA: Diagnosis not present

## 2023-03-09 DIAGNOSIS — L97524 Non-pressure chronic ulcer of other part of left foot with necrosis of bone: Secondary | ICD-10-CM | POA: Diagnosis not present

## 2023-03-09 DIAGNOSIS — L02612 Cutaneous abscess of left foot: Secondary | ICD-10-CM | POA: Diagnosis not present

## 2023-03-09 DIAGNOSIS — E08621 Diabetes mellitus due to underlying condition with foot ulcer: Secondary | ICD-10-CM | POA: Diagnosis not present

## 2023-03-09 DIAGNOSIS — L97501 Non-pressure chronic ulcer of other part of unspecified foot limited to breakdown of skin: Secondary | ICD-10-CM | POA: Diagnosis not present

## 2023-03-10 DIAGNOSIS — L97501 Non-pressure chronic ulcer of other part of unspecified foot limited to breakdown of skin: Secondary | ICD-10-CM | POA: Diagnosis not present

## 2023-03-10 DIAGNOSIS — L089 Local infection of the skin and subcutaneous tissue, unspecified: Secondary | ICD-10-CM | POA: Diagnosis not present

## 2023-03-10 DIAGNOSIS — N179 Acute kidney failure, unspecified: Secondary | ICD-10-CM | POA: Diagnosis not present

## 2023-03-10 DIAGNOSIS — E11628 Type 2 diabetes mellitus with other skin complications: Secondary | ICD-10-CM | POA: Diagnosis not present

## 2023-03-10 DIAGNOSIS — I739 Peripheral vascular disease, unspecified: Secondary | ICD-10-CM | POA: Diagnosis not present

## 2023-03-10 DIAGNOSIS — E11621 Type 2 diabetes mellitus with foot ulcer: Secondary | ICD-10-CM | POA: Diagnosis not present

## 2023-03-11 DIAGNOSIS — L089 Local infection of the skin and subcutaneous tissue, unspecified: Secondary | ICD-10-CM | POA: Diagnosis not present

## 2023-03-11 DIAGNOSIS — E11628 Type 2 diabetes mellitus with other skin complications: Secondary | ICD-10-CM | POA: Diagnosis not present

## 2023-03-12 DIAGNOSIS — E11621 Type 2 diabetes mellitus with foot ulcer: Secondary | ICD-10-CM | POA: Diagnosis not present

## 2023-03-13 DIAGNOSIS — L089 Local infection of the skin and subcutaneous tissue, unspecified: Secondary | ICD-10-CM | POA: Diagnosis not present

## 2023-03-13 DIAGNOSIS — E11628 Type 2 diabetes mellitus with other skin complications: Secondary | ICD-10-CM | POA: Diagnosis not present

## 2023-03-17 DIAGNOSIS — J449 Chronic obstructive pulmonary disease, unspecified: Secondary | ICD-10-CM | POA: Diagnosis not present

## 2023-03-17 DIAGNOSIS — G8929 Other chronic pain: Secondary | ICD-10-CM | POA: Diagnosis not present

## 2023-03-17 DIAGNOSIS — Z95820 Peripheral vascular angioplasty status with implants and grafts: Secondary | ICD-10-CM | POA: Diagnosis not present

## 2023-03-17 DIAGNOSIS — E11621 Type 2 diabetes mellitus with foot ulcer: Secondary | ICD-10-CM | POA: Diagnosis not present

## 2023-03-17 DIAGNOSIS — M1990 Unspecified osteoarthritis, unspecified site: Secondary | ICD-10-CM | POA: Diagnosis not present

## 2023-03-17 DIAGNOSIS — I739 Peripheral vascular disease, unspecified: Secondary | ICD-10-CM | POA: Diagnosis not present

## 2023-03-17 DIAGNOSIS — L97521 Non-pressure chronic ulcer of other part of left foot limited to breakdown of skin: Secondary | ICD-10-CM | POA: Diagnosis not present

## 2023-03-17 DIAGNOSIS — M549 Dorsalgia, unspecified: Secondary | ICD-10-CM | POA: Diagnosis not present

## 2023-03-17 DIAGNOSIS — L97421 Non-pressure chronic ulcer of left heel and midfoot limited to breakdown of skin: Secondary | ICD-10-CM | POA: Diagnosis not present

## 2023-03-17 DIAGNOSIS — I10 Essential (primary) hypertension: Secondary | ICD-10-CM | POA: Diagnosis not present

## 2023-03-19 DIAGNOSIS — L97529 Non-pressure chronic ulcer of other part of left foot with unspecified severity: Secondary | ICD-10-CM | POA: Diagnosis not present

## 2023-03-19 DIAGNOSIS — G8929 Other chronic pain: Secondary | ICD-10-CM | POA: Diagnosis not present

## 2023-03-19 DIAGNOSIS — M79672 Pain in left foot: Secondary | ICD-10-CM | POA: Diagnosis not present

## 2023-03-19 DIAGNOSIS — Z09 Encounter for follow-up examination after completed treatment for conditions other than malignant neoplasm: Secondary | ICD-10-CM | POA: Diagnosis not present

## 2023-03-19 DIAGNOSIS — E08621 Diabetes mellitus due to underlying condition with foot ulcer: Secondary | ICD-10-CM | POA: Diagnosis not present

## 2023-03-20 DIAGNOSIS — J449 Chronic obstructive pulmonary disease, unspecified: Secondary | ICD-10-CM | POA: Diagnosis not present

## 2023-03-20 DIAGNOSIS — M549 Dorsalgia, unspecified: Secondary | ICD-10-CM | POA: Diagnosis not present

## 2023-03-20 DIAGNOSIS — L97421 Non-pressure chronic ulcer of left heel and midfoot limited to breakdown of skin: Secondary | ICD-10-CM | POA: Diagnosis not present

## 2023-03-20 DIAGNOSIS — Z95820 Peripheral vascular angioplasty status with implants and grafts: Secondary | ICD-10-CM | POA: Diagnosis not present

## 2023-03-20 DIAGNOSIS — E11621 Type 2 diabetes mellitus with foot ulcer: Secondary | ICD-10-CM | POA: Diagnosis not present

## 2023-03-20 DIAGNOSIS — L97521 Non-pressure chronic ulcer of other part of left foot limited to breakdown of skin: Secondary | ICD-10-CM | POA: Diagnosis not present

## 2023-03-20 DIAGNOSIS — G8929 Other chronic pain: Secondary | ICD-10-CM | POA: Diagnosis not present

## 2023-03-20 DIAGNOSIS — M1990 Unspecified osteoarthritis, unspecified site: Secondary | ICD-10-CM | POA: Diagnosis not present

## 2023-03-20 DIAGNOSIS — I739 Peripheral vascular disease, unspecified: Secondary | ICD-10-CM | POA: Diagnosis not present

## 2023-03-20 DIAGNOSIS — I10 Essential (primary) hypertension: Secondary | ICD-10-CM | POA: Diagnosis not present

## 2023-03-22 DIAGNOSIS — E1142 Type 2 diabetes mellitus with diabetic polyneuropathy: Secondary | ICD-10-CM | POA: Diagnosis not present

## 2023-03-22 DIAGNOSIS — L97528 Non-pressure chronic ulcer of other part of left foot with other specified severity: Secondary | ICD-10-CM | POA: Diagnosis not present

## 2023-03-22 DIAGNOSIS — K219 Gastro-esophageal reflux disease without esophagitis: Secondary | ICD-10-CM | POA: Diagnosis not present

## 2023-03-22 DIAGNOSIS — E11621 Type 2 diabetes mellitus with foot ulcer: Secondary | ICD-10-CM | POA: Diagnosis not present

## 2023-03-22 DIAGNOSIS — L97422 Non-pressure chronic ulcer of left heel and midfoot with fat layer exposed: Secondary | ICD-10-CM | POA: Diagnosis not present

## 2023-03-22 DIAGNOSIS — R188 Other ascites: Secondary | ICD-10-CM | POA: Diagnosis not present

## 2023-03-22 DIAGNOSIS — K8689 Other specified diseases of pancreas: Secondary | ICD-10-CM | POA: Diagnosis not present

## 2023-03-22 DIAGNOSIS — M545 Low back pain, unspecified: Secondary | ICD-10-CM | POA: Diagnosis not present

## 2023-03-22 DIAGNOSIS — F1010 Alcohol abuse, uncomplicated: Secondary | ICD-10-CM | POA: Diagnosis not present

## 2023-03-22 DIAGNOSIS — K863 Pseudocyst of pancreas: Secondary | ICD-10-CM | POA: Diagnosis not present

## 2023-03-22 DIAGNOSIS — Z79899 Other long term (current) drug therapy: Secondary | ICD-10-CM | POA: Diagnosis not present

## 2023-03-22 DIAGNOSIS — Z95820 Peripheral vascular angioplasty status with implants and grafts: Secondary | ICD-10-CM | POA: Diagnosis not present

## 2023-03-22 DIAGNOSIS — K852 Alcohol induced acute pancreatitis without necrosis or infection: Secondary | ICD-10-CM | POA: Diagnosis not present

## 2023-03-22 DIAGNOSIS — I10 Essential (primary) hypertension: Secondary | ICD-10-CM | POA: Diagnosis not present

## 2023-03-22 DIAGNOSIS — R109 Unspecified abdominal pain: Secondary | ICD-10-CM | POA: Diagnosis not present

## 2023-03-22 DIAGNOSIS — K860 Alcohol-induced chronic pancreatitis: Secondary | ICD-10-CM | POA: Diagnosis not present

## 2023-03-22 DIAGNOSIS — E1169 Type 2 diabetes mellitus with other specified complication: Secondary | ICD-10-CM | POA: Diagnosis not present

## 2023-03-22 DIAGNOSIS — E1151 Type 2 diabetes mellitus with diabetic peripheral angiopathy without gangrene: Secondary | ICD-10-CM | POA: Diagnosis not present

## 2023-03-22 DIAGNOSIS — D509 Iron deficiency anemia, unspecified: Secondary | ICD-10-CM | POA: Diagnosis not present

## 2023-03-22 DIAGNOSIS — Z7902 Long term (current) use of antithrombotics/antiplatelets: Secondary | ICD-10-CM | POA: Diagnosis not present

## 2023-03-22 DIAGNOSIS — Z7984 Long term (current) use of oral hypoglycemic drugs: Secondary | ICD-10-CM | POA: Diagnosis not present

## 2023-03-22 DIAGNOSIS — I739 Peripheral vascular disease, unspecified: Secondary | ICD-10-CM | POA: Diagnosis not present

## 2023-03-22 DIAGNOSIS — Z7982 Long term (current) use of aspirin: Secondary | ICD-10-CM | POA: Diagnosis not present

## 2023-03-22 DIAGNOSIS — Z87891 Personal history of nicotine dependence: Secondary | ICD-10-CM | POA: Diagnosis not present

## 2023-03-22 DIAGNOSIS — K861 Other chronic pancreatitis: Secondary | ICD-10-CM | POA: Diagnosis not present

## 2023-03-22 DIAGNOSIS — E785 Hyperlipidemia, unspecified: Secondary | ICD-10-CM | POA: Diagnosis not present

## 2023-03-22 DIAGNOSIS — K859 Acute pancreatitis without necrosis or infection, unspecified: Secondary | ICD-10-CM | POA: Diagnosis not present

## 2023-03-22 DIAGNOSIS — K8681 Exocrine pancreatic insufficiency: Secondary | ICD-10-CM | POA: Diagnosis not present

## 2023-03-22 DIAGNOSIS — M869 Osteomyelitis, unspecified: Secondary | ICD-10-CM | POA: Diagnosis not present

## 2023-03-22 DIAGNOSIS — K760 Fatty (change of) liver, not elsewhere classified: Secondary | ICD-10-CM | POA: Diagnosis not present

## 2023-03-22 DIAGNOSIS — J449 Chronic obstructive pulmonary disease, unspecified: Secondary | ICD-10-CM | POA: Diagnosis not present

## 2023-03-22 DIAGNOSIS — R1010 Upper abdominal pain, unspecified: Secondary | ICD-10-CM | POA: Diagnosis not present

## 2023-03-23 DIAGNOSIS — K852 Alcohol induced acute pancreatitis without necrosis or infection: Secondary | ICD-10-CM | POA: Diagnosis not present

## 2023-03-24 DIAGNOSIS — K852 Alcohol induced acute pancreatitis without necrosis or infection: Secondary | ICD-10-CM | POA: Diagnosis not present

## 2023-03-25 DIAGNOSIS — M869 Osteomyelitis, unspecified: Secondary | ICD-10-CM | POA: Diagnosis not present

## 2023-03-25 DIAGNOSIS — K852 Alcohol induced acute pancreatitis without necrosis or infection: Secondary | ICD-10-CM | POA: Diagnosis not present

## 2023-03-25 DIAGNOSIS — K861 Other chronic pancreatitis: Secondary | ICD-10-CM | POA: Diagnosis not present

## 2023-03-25 DIAGNOSIS — I739 Peripheral vascular disease, unspecified: Secondary | ICD-10-CM | POA: Diagnosis not present

## 2023-03-25 DIAGNOSIS — K859 Acute pancreatitis without necrosis or infection, unspecified: Secondary | ICD-10-CM | POA: Diagnosis not present

## 2023-03-26 DIAGNOSIS — K852 Alcohol induced acute pancreatitis without necrosis or infection: Secondary | ICD-10-CM | POA: Diagnosis not present

## 2023-03-27 DIAGNOSIS — R188 Other ascites: Secondary | ICD-10-CM | POA: Diagnosis not present

## 2023-03-27 DIAGNOSIS — K861 Other chronic pancreatitis: Secondary | ICD-10-CM | POA: Diagnosis not present

## 2023-03-27 DIAGNOSIS — K852 Alcohol induced acute pancreatitis without necrosis or infection: Secondary | ICD-10-CM | POA: Diagnosis not present

## 2023-03-27 DIAGNOSIS — K449 Diaphragmatic hernia without obstruction or gangrene: Secondary | ICD-10-CM | POA: Diagnosis not present

## 2023-03-27 DIAGNOSIS — K8689 Other specified diseases of pancreas: Secondary | ICD-10-CM | POA: Diagnosis not present

## 2023-03-28 DIAGNOSIS — K852 Alcohol induced acute pancreatitis without necrosis or infection: Secondary | ICD-10-CM | POA: Diagnosis not present

## 2023-03-29 DIAGNOSIS — K852 Alcohol induced acute pancreatitis without necrosis or infection: Secondary | ICD-10-CM | POA: Diagnosis not present

## 2023-03-30 DIAGNOSIS — K852 Alcohol induced acute pancreatitis without necrosis or infection: Secondary | ICD-10-CM | POA: Diagnosis not present

## 2023-03-31 DIAGNOSIS — K859 Acute pancreatitis without necrosis or infection, unspecified: Secondary | ICD-10-CM | POA: Diagnosis not present

## 2023-03-31 DIAGNOSIS — K861 Other chronic pancreatitis: Secondary | ICD-10-CM | POA: Diagnosis not present

## 2023-04-02 DIAGNOSIS — J449 Chronic obstructive pulmonary disease, unspecified: Secondary | ICD-10-CM | POA: Diagnosis not present

## 2023-04-02 DIAGNOSIS — I739 Peripheral vascular disease, unspecified: Secondary | ICD-10-CM | POA: Diagnosis not present

## 2023-04-02 DIAGNOSIS — L97421 Non-pressure chronic ulcer of left heel and midfoot limited to breakdown of skin: Secondary | ICD-10-CM | POA: Diagnosis not present

## 2023-04-02 DIAGNOSIS — G8929 Other chronic pain: Secondary | ICD-10-CM | POA: Diagnosis not present

## 2023-04-02 DIAGNOSIS — M549 Dorsalgia, unspecified: Secondary | ICD-10-CM | POA: Diagnosis not present

## 2023-04-02 DIAGNOSIS — M1990 Unspecified osteoarthritis, unspecified site: Secondary | ICD-10-CM | POA: Diagnosis not present

## 2023-04-02 DIAGNOSIS — I10 Essential (primary) hypertension: Secondary | ICD-10-CM | POA: Diagnosis not present

## 2023-04-02 DIAGNOSIS — E11621 Type 2 diabetes mellitus with foot ulcer: Secondary | ICD-10-CM | POA: Diagnosis not present

## 2023-04-02 DIAGNOSIS — L97521 Non-pressure chronic ulcer of other part of left foot limited to breakdown of skin: Secondary | ICD-10-CM | POA: Diagnosis not present

## 2023-04-02 DIAGNOSIS — Z95820 Peripheral vascular angioplasty status with implants and grafts: Secondary | ICD-10-CM | POA: Diagnosis not present

## 2023-04-06 DIAGNOSIS — E11621 Type 2 diabetes mellitus with foot ulcer: Secondary | ICD-10-CM | POA: Diagnosis not present

## 2023-04-06 DIAGNOSIS — L97528 Non-pressure chronic ulcer of other part of left foot with other specified severity: Secondary | ICD-10-CM | POA: Diagnosis not present

## 2023-04-06 DIAGNOSIS — L97511 Non-pressure chronic ulcer of other part of right foot limited to breakdown of skin: Secondary | ICD-10-CM | POA: Diagnosis not present

## 2023-04-07 DIAGNOSIS — Z09 Encounter for follow-up examination after completed treatment for conditions other than malignant neoplasm: Secondary | ICD-10-CM | POA: Diagnosis not present

## 2023-04-07 DIAGNOSIS — K852 Alcohol induced acute pancreatitis without necrosis or infection: Secondary | ICD-10-CM | POA: Diagnosis not present

## 2023-04-07 DIAGNOSIS — R339 Retention of urine, unspecified: Secondary | ICD-10-CM | POA: Diagnosis not present

## 2023-04-13 DIAGNOSIS — M549 Dorsalgia, unspecified: Secondary | ICD-10-CM | POA: Diagnosis not present

## 2023-04-13 DIAGNOSIS — J449 Chronic obstructive pulmonary disease, unspecified: Secondary | ICD-10-CM | POA: Diagnosis not present

## 2023-04-13 DIAGNOSIS — L97421 Non-pressure chronic ulcer of left heel and midfoot limited to breakdown of skin: Secondary | ICD-10-CM | POA: Diagnosis not present

## 2023-04-13 DIAGNOSIS — E11621 Type 2 diabetes mellitus with foot ulcer: Secondary | ICD-10-CM | POA: Diagnosis not present

## 2023-04-13 DIAGNOSIS — I739 Peripheral vascular disease, unspecified: Secondary | ICD-10-CM | POA: Diagnosis not present

## 2023-04-13 DIAGNOSIS — G8929 Other chronic pain: Secondary | ICD-10-CM | POA: Diagnosis not present

## 2023-04-13 DIAGNOSIS — Z95820 Peripheral vascular angioplasty status with implants and grafts: Secondary | ICD-10-CM | POA: Diagnosis not present

## 2023-04-13 DIAGNOSIS — M1990 Unspecified osteoarthritis, unspecified site: Secondary | ICD-10-CM | POA: Diagnosis not present

## 2023-04-13 DIAGNOSIS — I10 Essential (primary) hypertension: Secondary | ICD-10-CM | POA: Diagnosis not present

## 2023-04-13 DIAGNOSIS — L97521 Non-pressure chronic ulcer of other part of left foot limited to breakdown of skin: Secondary | ICD-10-CM | POA: Diagnosis not present

## 2023-04-23 DIAGNOSIS — R000 Tachycardia, unspecified: Secondary | ICD-10-CM | POA: Diagnosis not present

## 2023-04-23 DIAGNOSIS — D649 Anemia, unspecified: Secondary | ICD-10-CM | POA: Diagnosis not present

## 2023-04-27 DIAGNOSIS — Z7984 Long term (current) use of oral hypoglycemic drugs: Secondary | ICD-10-CM | POA: Diagnosis not present

## 2023-04-27 DIAGNOSIS — E11621 Type 2 diabetes mellitus with foot ulcer: Secondary | ICD-10-CM | POA: Diagnosis not present

## 2023-04-27 DIAGNOSIS — L97412 Non-pressure chronic ulcer of right heel and midfoot with fat layer exposed: Secondary | ICD-10-CM | POA: Diagnosis not present

## 2023-04-27 DIAGNOSIS — L97422 Non-pressure chronic ulcer of left heel and midfoot with fat layer exposed: Secondary | ICD-10-CM | POA: Diagnosis not present

## 2023-05-06 DIAGNOSIS — L97521 Non-pressure chronic ulcer of other part of left foot limited to breakdown of skin: Secondary | ICD-10-CM | POA: Diagnosis not present

## 2023-05-06 DIAGNOSIS — E11621 Type 2 diabetes mellitus with foot ulcer: Secondary | ICD-10-CM | POA: Diagnosis not present

## 2023-05-06 DIAGNOSIS — J449 Chronic obstructive pulmonary disease, unspecified: Secondary | ICD-10-CM | POA: Diagnosis not present

## 2023-05-06 DIAGNOSIS — I739 Peripheral vascular disease, unspecified: Secondary | ICD-10-CM | POA: Diagnosis not present

## 2023-05-06 DIAGNOSIS — G8929 Other chronic pain: Secondary | ICD-10-CM | POA: Diagnosis not present

## 2023-05-06 DIAGNOSIS — I10 Essential (primary) hypertension: Secondary | ICD-10-CM | POA: Diagnosis not present

## 2023-05-06 DIAGNOSIS — Z95820 Peripheral vascular angioplasty status with implants and grafts: Secondary | ICD-10-CM | POA: Diagnosis not present

## 2023-05-06 DIAGNOSIS — M549 Dorsalgia, unspecified: Secondary | ICD-10-CM | POA: Diagnosis not present

## 2023-05-06 DIAGNOSIS — M1990 Unspecified osteoarthritis, unspecified site: Secondary | ICD-10-CM | POA: Diagnosis not present

## 2023-05-06 DIAGNOSIS — L97421 Non-pressure chronic ulcer of left heel and midfoot limited to breakdown of skin: Secondary | ICD-10-CM | POA: Diagnosis not present

## 2023-05-08 DIAGNOSIS — L97421 Non-pressure chronic ulcer of left heel and midfoot limited to breakdown of skin: Secondary | ICD-10-CM | POA: Diagnosis not present

## 2023-05-08 DIAGNOSIS — Z95820 Peripheral vascular angioplasty status with implants and grafts: Secondary | ICD-10-CM | POA: Diagnosis not present

## 2023-05-08 DIAGNOSIS — M1990 Unspecified osteoarthritis, unspecified site: Secondary | ICD-10-CM | POA: Diagnosis not present

## 2023-05-08 DIAGNOSIS — J449 Chronic obstructive pulmonary disease, unspecified: Secondary | ICD-10-CM | POA: Diagnosis not present

## 2023-05-08 DIAGNOSIS — L97521 Non-pressure chronic ulcer of other part of left foot limited to breakdown of skin: Secondary | ICD-10-CM | POA: Diagnosis not present

## 2023-05-08 DIAGNOSIS — I739 Peripheral vascular disease, unspecified: Secondary | ICD-10-CM | POA: Diagnosis not present

## 2023-05-08 DIAGNOSIS — E11621 Type 2 diabetes mellitus with foot ulcer: Secondary | ICD-10-CM | POA: Diagnosis not present

## 2023-05-08 DIAGNOSIS — I10 Essential (primary) hypertension: Secondary | ICD-10-CM | POA: Diagnosis not present

## 2023-05-08 DIAGNOSIS — M549 Dorsalgia, unspecified: Secondary | ICD-10-CM | POA: Diagnosis not present

## 2023-05-08 DIAGNOSIS — G8929 Other chronic pain: Secondary | ICD-10-CM | POA: Diagnosis not present

## 2023-05-18 DIAGNOSIS — E11621 Type 2 diabetes mellitus with foot ulcer: Secondary | ICD-10-CM | POA: Diagnosis not present

## 2023-05-18 DIAGNOSIS — Z7984 Long term (current) use of oral hypoglycemic drugs: Secondary | ICD-10-CM | POA: Diagnosis not present

## 2023-05-18 DIAGNOSIS — E1151 Type 2 diabetes mellitus with diabetic peripheral angiopathy without gangrene: Secondary | ICD-10-CM | POA: Diagnosis not present

## 2023-05-18 DIAGNOSIS — L97422 Non-pressure chronic ulcer of left heel and midfoot with fat layer exposed: Secondary | ICD-10-CM | POA: Diagnosis not present

## 2023-05-18 DIAGNOSIS — I70219 Atherosclerosis of native arteries of extremities with intermittent claudication, unspecified extremity: Secondary | ICD-10-CM | POA: Diagnosis not present

## 2023-05-19 DIAGNOSIS — I739 Peripheral vascular disease, unspecified: Secondary | ICD-10-CM | POA: Diagnosis not present

## 2023-05-19 DIAGNOSIS — I10 Essential (primary) hypertension: Secondary | ICD-10-CM | POA: Diagnosis not present

## 2023-05-19 DIAGNOSIS — G8929 Other chronic pain: Secondary | ICD-10-CM | POA: Diagnosis not present

## 2023-05-19 DIAGNOSIS — G4733 Obstructive sleep apnea (adult) (pediatric): Secondary | ICD-10-CM | POA: Diagnosis not present

## 2023-05-19 DIAGNOSIS — M1990 Unspecified osteoarthritis, unspecified site: Secondary | ICD-10-CM | POA: Diagnosis not present

## 2023-05-19 DIAGNOSIS — Z95820 Peripheral vascular angioplasty status with implants and grafts: Secondary | ICD-10-CM | POA: Diagnosis not present

## 2023-05-19 DIAGNOSIS — E11621 Type 2 diabetes mellitus with foot ulcer: Secondary | ICD-10-CM | POA: Diagnosis not present

## 2023-05-19 DIAGNOSIS — J449 Chronic obstructive pulmonary disease, unspecified: Secondary | ICD-10-CM | POA: Diagnosis not present

## 2023-05-19 DIAGNOSIS — M549 Dorsalgia, unspecified: Secondary | ICD-10-CM | POA: Diagnosis not present

## 2023-05-19 DIAGNOSIS — L97521 Non-pressure chronic ulcer of other part of left foot limited to breakdown of skin: Secondary | ICD-10-CM | POA: Diagnosis not present

## 2023-05-28 DIAGNOSIS — Z23 Encounter for immunization: Secondary | ICD-10-CM | POA: Diagnosis not present

## 2023-05-28 DIAGNOSIS — D649 Anemia, unspecified: Secondary | ICD-10-CM | POA: Diagnosis not present

## 2023-05-28 DIAGNOSIS — E119 Type 2 diabetes mellitus without complications: Secondary | ICD-10-CM | POA: Diagnosis not present

## 2023-05-28 DIAGNOSIS — Z113 Encounter for screening for infections with a predominantly sexual mode of transmission: Secondary | ICD-10-CM | POA: Diagnosis not present

## 2023-06-01 DIAGNOSIS — L97412 Non-pressure chronic ulcer of right heel and midfoot with fat layer exposed: Secondary | ICD-10-CM | POA: Diagnosis not present

## 2023-06-01 DIAGNOSIS — L97422 Non-pressure chronic ulcer of left heel and midfoot with fat layer exposed: Secondary | ICD-10-CM | POA: Diagnosis not present

## 2023-06-01 DIAGNOSIS — E11621 Type 2 diabetes mellitus with foot ulcer: Secondary | ICD-10-CM | POA: Diagnosis not present

## 2023-06-02 DIAGNOSIS — M549 Dorsalgia, unspecified: Secondary | ICD-10-CM | POA: Diagnosis not present

## 2023-06-02 DIAGNOSIS — I10 Essential (primary) hypertension: Secondary | ICD-10-CM | POA: Diagnosis not present

## 2023-06-02 DIAGNOSIS — E11621 Type 2 diabetes mellitus with foot ulcer: Secondary | ICD-10-CM | POA: Diagnosis not present

## 2023-06-02 DIAGNOSIS — G4733 Obstructive sleep apnea (adult) (pediatric): Secondary | ICD-10-CM | POA: Diagnosis not present

## 2023-06-02 DIAGNOSIS — I739 Peripheral vascular disease, unspecified: Secondary | ICD-10-CM | POA: Diagnosis not present

## 2023-06-02 DIAGNOSIS — L97521 Non-pressure chronic ulcer of other part of left foot limited to breakdown of skin: Secondary | ICD-10-CM | POA: Diagnosis not present

## 2023-06-02 DIAGNOSIS — G8929 Other chronic pain: Secondary | ICD-10-CM | POA: Diagnosis not present

## 2023-06-02 DIAGNOSIS — M1990 Unspecified osteoarthritis, unspecified site: Secondary | ICD-10-CM | POA: Diagnosis not present

## 2023-06-02 DIAGNOSIS — Z95820 Peripheral vascular angioplasty status with implants and grafts: Secondary | ICD-10-CM | POA: Diagnosis not present

## 2023-06-02 DIAGNOSIS — J449 Chronic obstructive pulmonary disease, unspecified: Secondary | ICD-10-CM | POA: Diagnosis not present

## 2023-06-11 DIAGNOSIS — Z7984 Long term (current) use of oral hypoglycemic drugs: Secondary | ICD-10-CM | POA: Diagnosis not present

## 2023-06-11 DIAGNOSIS — H04123 Dry eye syndrome of bilateral lacrimal glands: Secondary | ICD-10-CM | POA: Diagnosis not present

## 2023-06-11 DIAGNOSIS — H52203 Unspecified astigmatism, bilateral: Secondary | ICD-10-CM | POA: Diagnosis not present

## 2023-06-11 DIAGNOSIS — E119 Type 2 diabetes mellitus without complications: Secondary | ICD-10-CM | POA: Diagnosis not present

## 2023-06-11 DIAGNOSIS — H43393 Other vitreous opacities, bilateral: Secondary | ICD-10-CM | POA: Diagnosis not present

## 2023-06-11 DIAGNOSIS — H5203 Hypermetropia, bilateral: Secondary | ICD-10-CM | POA: Diagnosis not present

## 2023-06-11 DIAGNOSIS — H2513 Age-related nuclear cataract, bilateral: Secondary | ICD-10-CM | POA: Diagnosis not present

## 2023-06-11 DIAGNOSIS — H524 Presbyopia: Secondary | ICD-10-CM | POA: Diagnosis not present

## 2023-06-15 DIAGNOSIS — I70219 Atherosclerosis of native arteries of extremities with intermittent claudication, unspecified extremity: Secondary | ICD-10-CM | POA: Diagnosis not present

## 2023-06-15 DIAGNOSIS — Z7984 Long term (current) use of oral hypoglycemic drugs: Secondary | ICD-10-CM | POA: Diagnosis not present

## 2023-06-15 DIAGNOSIS — E1151 Type 2 diabetes mellitus with diabetic peripheral angiopathy without gangrene: Secondary | ICD-10-CM | POA: Diagnosis not present

## 2023-06-15 DIAGNOSIS — L97422 Non-pressure chronic ulcer of left heel and midfoot with fat layer exposed: Secondary | ICD-10-CM | POA: Diagnosis not present

## 2023-06-15 DIAGNOSIS — E11621 Type 2 diabetes mellitus with foot ulcer: Secondary | ICD-10-CM | POA: Diagnosis not present

## 2023-06-18 DIAGNOSIS — E11621 Type 2 diabetes mellitus with foot ulcer: Secondary | ICD-10-CM | POA: Diagnosis not present

## 2023-06-18 DIAGNOSIS — I10 Essential (primary) hypertension: Secondary | ICD-10-CM | POA: Diagnosis not present

## 2023-06-18 DIAGNOSIS — J449 Chronic obstructive pulmonary disease, unspecified: Secondary | ICD-10-CM | POA: Diagnosis not present

## 2023-06-18 DIAGNOSIS — G4733 Obstructive sleep apnea (adult) (pediatric): Secondary | ICD-10-CM | POA: Diagnosis not present

## 2023-06-18 DIAGNOSIS — L97521 Non-pressure chronic ulcer of other part of left foot limited to breakdown of skin: Secondary | ICD-10-CM | POA: Diagnosis not present

## 2023-06-18 DIAGNOSIS — M1990 Unspecified osteoarthritis, unspecified site: Secondary | ICD-10-CM | POA: Diagnosis not present

## 2023-06-18 DIAGNOSIS — I739 Peripheral vascular disease, unspecified: Secondary | ICD-10-CM | POA: Diagnosis not present

## 2023-06-18 DIAGNOSIS — G8929 Other chronic pain: Secondary | ICD-10-CM | POA: Diagnosis not present

## 2023-06-18 DIAGNOSIS — Z95820 Peripheral vascular angioplasty status with implants and grafts: Secondary | ICD-10-CM | POA: Diagnosis not present

## 2023-06-18 DIAGNOSIS — M549 Dorsalgia, unspecified: Secondary | ICD-10-CM | POA: Diagnosis not present

## 2023-06-29 DIAGNOSIS — I70219 Atherosclerosis of native arteries of extremities with intermittent claudication, unspecified extremity: Secondary | ICD-10-CM | POA: Diagnosis not present

## 2023-06-29 DIAGNOSIS — E1151 Type 2 diabetes mellitus with diabetic peripheral angiopathy without gangrene: Secondary | ICD-10-CM | POA: Diagnosis not present

## 2023-06-29 DIAGNOSIS — Z8631 Personal history of diabetic foot ulcer: Secondary | ICD-10-CM | POA: Diagnosis not present

## 2023-07-02 DIAGNOSIS — Z0000 Encounter for general adult medical examination without abnormal findings: Secondary | ICD-10-CM | POA: Diagnosis not present

## 2023-08-05 DIAGNOSIS — Z8631 Personal history of diabetic foot ulcer: Secondary | ICD-10-CM | POA: Diagnosis not present

## 2023-08-05 DIAGNOSIS — I70219 Atherosclerosis of native arteries of extremities with intermittent claudication, unspecified extremity: Secondary | ICD-10-CM | POA: Diagnosis not present

## 2023-08-05 DIAGNOSIS — E1151 Type 2 diabetes mellitus with diabetic peripheral angiopathy without gangrene: Secondary | ICD-10-CM | POA: Diagnosis not present

## 2023-08-19 DIAGNOSIS — Z23 Encounter for immunization: Secondary | ICD-10-CM | POA: Diagnosis not present

## 2023-08-19 DIAGNOSIS — E119 Type 2 diabetes mellitus without complications: Secondary | ICD-10-CM | POA: Diagnosis not present

## 2023-08-19 DIAGNOSIS — M25551 Pain in right hip: Secondary | ICD-10-CM | POA: Diagnosis not present

## 2023-08-19 DIAGNOSIS — M7022 Olecranon bursitis, left elbow: Secondary | ICD-10-CM | POA: Diagnosis not present
